# Patient Record
Sex: Male | Born: 1990 | Race: Black or African American | Hispanic: No | Marital: Married | Smoking: Never smoker
Health system: Southern US, Community
[De-identification: ages and names within clinical notes are randomized; demographics above are authoritative.]

## PROBLEM LIST (undated history)

## (undated) DIAGNOSIS — T1491XA Suicide attempt, initial encounter: Secondary | ICD-10-CM

## (undated) DIAGNOSIS — F909 Attention-deficit hyperactivity disorder, unspecified type: Secondary | ICD-10-CM

## (undated) DIAGNOSIS — Z8619 Personal history of other infectious and parasitic diseases: Secondary | ICD-10-CM

## (undated) DIAGNOSIS — J40 Bronchitis, not specified as acute or chronic: Secondary | ICD-10-CM

## (undated) DIAGNOSIS — K469 Unspecified abdominal hernia without obstruction or gangrene: Secondary | ICD-10-CM

## (undated) DIAGNOSIS — A749 Chlamydial infection, unspecified: Secondary | ICD-10-CM

## (undated) DIAGNOSIS — R7611 Nonspecific reaction to tuberculin skin test without active tuberculosis: Secondary | ICD-10-CM

## (undated) DIAGNOSIS — F329 Major depressive disorder, single episode, unspecified: Secondary | ICD-10-CM

## (undated) HISTORY — DX: Chlamydial infection, unspecified: A74.9

## (undated) HISTORY — DX: Nonspecific reaction to tuberculin skin test without active tuberculosis: R76.11

## (undated) HISTORY — DX: Personal history of other infectious and parasitic diseases: Z86.19

## (undated) HISTORY — DX: Major depressive disorder, single episode, unspecified: F32.9

## (undated) HISTORY — DX: Attention-deficit hyperactivity disorder, unspecified type: F90.9

## (undated) HISTORY — DX: Suicide attempt, initial encounter: T14.91XA

## (undated) HISTORY — DX: Unspecified abdominal hernia without obstruction or gangrene: K46.9

---
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
## 2009-02-14 ENCOUNTER — Emergency Department (HOSPITAL_COMMUNITY): Admission: EM | Admit: 2009-02-14 | Discharge: 2009-02-15 | Payer: Self-pay | Admitting: Emergency Medicine

## 2009-02-16 ENCOUNTER — Emergency Department (HOSPITAL_COMMUNITY): Admission: EM | Admit: 2009-02-16 | Discharge: 2009-02-16 | Payer: Self-pay | Admitting: Emergency Medicine

## 2009-04-25 ENCOUNTER — Emergency Department (HOSPITAL_COMMUNITY): Admission: EM | Admit: 2009-04-25 | Discharge: 2009-04-25 | Payer: Self-pay | Admitting: Emergency Medicine

## 2010-01-03 ENCOUNTER — Emergency Department (HOSPITAL_COMMUNITY): Admission: EM | Admit: 2010-01-03 | Discharge: 2010-01-03 | Payer: Self-pay | Admitting: Emergency Medicine

## 2010-03-21 ENCOUNTER — Emergency Department (HOSPITAL_COMMUNITY): Admission: EM | Admit: 2010-03-21 | Discharge: 2010-03-22 | Payer: Self-pay | Admitting: Emergency Medicine

## 2010-06-15 ENCOUNTER — Emergency Department (HOSPITAL_COMMUNITY): Admission: EM | Admit: 2010-06-15 | Discharge: 2010-06-15 | Payer: Self-pay | Admitting: Emergency Medicine

## 2010-07-25 ENCOUNTER — Emergency Department (HOSPITAL_COMMUNITY)
Admission: EM | Admit: 2010-07-25 | Discharge: 2010-07-26 | Payer: Self-pay | Source: Home / Self Care | Admitting: Emergency Medicine

## 2010-10-29 LAB — URINALYSIS, ROUTINE W REFLEX MICROSCOPIC
Glucose, UA: NEGATIVE mg/dL
Protein, ur: NEGATIVE mg/dL
Specific Gravity, Urine: 1.015 (ref 1.005–1.030)
pH: 5.5 (ref 5.0–8.0)

## 2010-10-29 LAB — DIFFERENTIAL
Eosinophils Absolute: 0.3 10*3/uL (ref 0.0–0.7)
Lymphs Abs: 1.7 10*3/uL (ref 0.7–4.0)
Monocytes Relative: 12 % (ref 3–12)
Neutrophils Relative %: 54 % (ref 43–77)

## 2010-10-29 LAB — CBC
MCV: 82.4 fL (ref 78.0–100.0)
RBC: 5.45 MIL/uL (ref 4.22–5.81)
WBC: 6 10*3/uL (ref 4.0–10.5)

## 2010-10-29 LAB — COMPREHENSIVE METABOLIC PANEL
BUN: 24 mg/dL — ABNORMAL HIGH (ref 6–23)
Calcium: 10.1 mg/dL (ref 8.4–10.5)
Glucose, Bld: 101 mg/dL — ABNORMAL HIGH (ref 70–99)
Total Protein: 7.9 g/dL (ref 6.0–8.3)

## 2010-10-29 LAB — BASIC METABOLIC PANEL
Chloride: 106 mEq/L (ref 96–112)
Creatinine, Ser: 0.72 mg/dL (ref 0.4–1.5)
GFR calc Af Amer: >60 mL/min (ref >60)
Potassium: 4.1 mEq/L (ref 3.5–5.1)
Sodium: 141 mEq/L (ref 135–145)

## 2010-11-22 ENCOUNTER — Emergency Department (HOSPITAL_COMMUNITY)
Admission: EM | Admit: 2010-11-22 | Discharge: 2010-11-22 | Disposition: A | Discharge status: Home / Self Care | Payer: Worker's Compensation | Attending: Emergency Medicine | Admitting: Emergency Medicine

## 2010-11-22 ENCOUNTER — Emergency Department (HOSPITAL_COMMUNITY): Payer: Worker's Compensation

## 2010-11-22 DIAGNOSIS — X500XXA Overexertion from strenuous movement or load, initial encounter: Secondary | ICD-10-CM | POA: Insufficient documentation

## 2010-11-22 DIAGNOSIS — M79609 Pain in unspecified limb: Secondary | ICD-10-CM | POA: Insufficient documentation

## 2010-11-22 DIAGNOSIS — M25476 Effusion, unspecified foot: Secondary | ICD-10-CM | POA: Insufficient documentation

## 2010-11-22 DIAGNOSIS — S93409A Sprain of unspecified ligament of unspecified ankle, initial encounter: Secondary | ICD-10-CM | POA: Insufficient documentation

## 2010-11-22 DIAGNOSIS — Y990 Civilian activity done for income or pay: Secondary | ICD-10-CM | POA: Insufficient documentation

## 2010-11-22 DIAGNOSIS — J45909 Unspecified asthma, uncomplicated: Secondary | ICD-10-CM | POA: Insufficient documentation

## 2010-11-22 DIAGNOSIS — M25473 Effusion, unspecified ankle: Secondary | ICD-10-CM | POA: Insufficient documentation

## 2010-11-22 DIAGNOSIS — S93609A Unspecified sprain of unspecified foot, initial encounter: Secondary | ICD-10-CM | POA: Insufficient documentation

## 2010-11-22 DIAGNOSIS — M25579 Pain in unspecified ankle and joints of unspecified foot: Secondary | ICD-10-CM | POA: Insufficient documentation

## 2011-01-13 ENCOUNTER — Emergency Department (HOSPITAL_COMMUNITY)
Admission: EM | Admit: 2011-01-13 | Discharge: 2011-01-14 | Disposition: A | Discharge status: Other Acute Inpatient Hospital | Payer: Self-pay | Attending: Emergency Medicine | Admitting: Emergency Medicine

## 2011-01-13 DIAGNOSIS — X818XXA Intentional self-harm by jumping or lying in front of other moving object, initial encounter: Secondary | ICD-10-CM | POA: Insufficient documentation

## 2011-01-13 DIAGNOSIS — E876 Hypokalemia: Secondary | ICD-10-CM | POA: Insufficient documentation

## 2011-01-13 DIAGNOSIS — F489 Nonpsychotic mental disorder, unspecified: Secondary | ICD-10-CM | POA: Insufficient documentation

## 2011-01-13 LAB — COMPREHENSIVE METABOLIC PANEL
BUN: 9 mg/dL (ref 6–23)
Calcium: 10.2 mg/dL (ref 8.4–10.5)
Creatinine, Ser: 0.8 mg/dL (ref 0.50–1.35)
GFR calc Af Amer: >60 mL/min (ref >60)
Glucose, Bld: 93 mg/dL (ref 70–99)
Total Protein: 7.4 g/dL (ref 6.0–8.3)

## 2011-01-13 LAB — ETHANOL: Alcohol, Ethyl (B): <11 mg/dL (ref 0–11)

## 2011-01-13 LAB — DIFFERENTIAL
Basophils Relative: 1 % (ref 0–1)
Lymphocytes Relative: 27 % (ref 12–46)
Monocytes Absolute: 0.8 10*3/uL (ref 0.1–1.0)
Monocytes Relative: 10 % (ref 3–12)
Neutro Abs: 4.9 10*3/uL (ref 1.7–7.7)

## 2011-01-13 LAB — CBC
HCT: 44.4 % (ref 39.0–52.0)
Hemoglobin: 15 g/dL (ref 13.0–17.0)
MCH: 27.4 pg (ref 26.0–34.0)
MCHC: 33.8 g/dL (ref 30.0–36.0)
MCV: 81 fL (ref 78.0–100.0)

## 2011-01-14 ENCOUNTER — Inpatient Hospital Stay (HOSPITAL_COMMUNITY)
Admission: AD | Admit: 2011-01-14 | Discharge: 2011-01-18 | DRG: 881 | Disposition: A | Discharge status: Home / Self Care | Payer: PRIVATE HEALTH INSURANCE | Attending: Psychiatry | Admitting: Psychiatry

## 2011-01-14 DIAGNOSIS — R45851 Suicidal ideations: Secondary | ICD-10-CM

## 2011-01-14 DIAGNOSIS — F3289 Other specified depressive episodes: Principal | ICD-10-CM

## 2011-01-14 DIAGNOSIS — Z818 Family history of other mental and behavioral disorders: Secondary | ICD-10-CM

## 2011-01-14 DIAGNOSIS — J45909 Unspecified asthma, uncomplicated: Secondary | ICD-10-CM

## 2011-01-14 DIAGNOSIS — F329 Major depressive disorder, single episode, unspecified: Principal | ICD-10-CM

## 2011-01-14 LAB — RAPID URINE DRUG SCREEN, HOSP PERFORMED
Benzodiazepines: NOT DETECTED
Cocaine: NOT DETECTED

## 2011-01-15 DIAGNOSIS — F3289 Other specified depressive episodes: Secondary | ICD-10-CM

## 2011-01-15 DIAGNOSIS — F329 Major depressive disorder, single episode, unspecified: Secondary | ICD-10-CM
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
## 2011-03-10 ENCOUNTER — Emergency Department (HOSPITAL_COMMUNITY): Payer: Self-pay

## 2011-03-10 ENCOUNTER — Emergency Department (HOSPITAL_COMMUNITY)
Admission: EM | Admit: 2011-03-10 | Discharge: 2011-03-11 | Disposition: A | Discharge status: Home / Self Care | Payer: Self-pay | Attending: Emergency Medicine | Admitting: Emergency Medicine

## 2011-03-10 DIAGNOSIS — M25539 Pain in unspecified wrist: Secondary | ICD-10-CM | POA: Insufficient documentation

## 2011-03-10 DIAGNOSIS — W2209XA Striking against other stationary object, initial encounter: Secondary | ICD-10-CM | POA: Insufficient documentation

## 2011-03-10 DIAGNOSIS — IMO0002 Reserved for concepts with insufficient information to code with codable children: Secondary | ICD-10-CM | POA: Insufficient documentation

## 2011-03-10 DIAGNOSIS — S60229A Contusion of unspecified hand, initial encounter: Secondary | ICD-10-CM | POA: Insufficient documentation

## 2011-03-11 ENCOUNTER — Emergency Department (HOSPITAL_COMMUNITY): Payer: Self-pay

## 2011-03-11 LAB — DIFFERENTIAL
Eosinophils Absolute: 0.1 10*3/uL (ref 0.0–0.7)
Eosinophils Relative: 1 % (ref 0–5)
Lymphs Abs: 0.9 10*3/uL (ref 0.7–4.0)
Monocytes Relative: 6 % (ref 3–12)

## 2011-03-11 LAB — BASIC METABOLIC PANEL
BUN: 13 mg/dL (ref 6–23)
Chloride: 104 mEq/L (ref 96–112)
Creatinine, Ser: 0.8 mg/dL (ref 0.50–1.35)
GFR calc Af Amer: >60 mL/min (ref >60)
Glucose, Bld: 113 mg/dL — ABNORMAL HIGH (ref 70–99)

## 2011-03-11 LAB — CBC
HCT: 45.7 % (ref 39.0–52.0)
MCH: 27.4 pg (ref 26.0–34.0)
MCV: 84.2 fL (ref 78.0–100.0)
Platelets: 359 10*3/uL (ref 150–400)
RBC: 5.43 MIL/uL (ref 4.22–5.81)

## 2011-03-29 ENCOUNTER — Emergency Department (HOSPITAL_COMMUNITY)
Admission: EM | Admit: 2011-03-29 | Discharge: 2011-03-29 | Disposition: A | Discharge status: Home / Self Care | Payer: Self-pay | Attending: Emergency Medicine | Admitting: Emergency Medicine

## 2011-03-29 DIAGNOSIS — R10819 Abdominal tenderness, unspecified site: Secondary | ICD-10-CM | POA: Insufficient documentation

## 2011-03-29 DIAGNOSIS — K2990 Gastroduodenitis, unspecified, without bleeding: Secondary | ICD-10-CM | POA: Insufficient documentation

## 2011-03-29 DIAGNOSIS — K2970 Gastritis, unspecified, without bleeding: Secondary | ICD-10-CM | POA: Insufficient documentation

## 2011-04-05 ENCOUNTER — Emergency Department (HOSPITAL_COMMUNITY)
Admission: EM | Admit: 2011-04-05 | Discharge: 2011-04-05 | Disposition: A | Discharge status: Home / Self Care | Payer: Self-pay | Attending: Emergency Medicine | Admitting: Emergency Medicine

## 2011-04-05 ENCOUNTER — Emergency Department (HOSPITAL_COMMUNITY): Payer: Self-pay

## 2011-04-05 DIAGNOSIS — R079 Chest pain, unspecified: Secondary | ICD-10-CM | POA: Insufficient documentation

## 2011-04-05 DIAGNOSIS — I300 Acute nonspecific idiopathic pericarditis: Secondary | ICD-10-CM

## 2011-04-05 DIAGNOSIS — R0602 Shortness of breath: Secondary | ICD-10-CM | POA: Insufficient documentation

## 2011-04-05 DIAGNOSIS — I319 Disease of pericardium, unspecified: Secondary | ICD-10-CM | POA: Insufficient documentation

## 2011-04-05 LAB — CBC
HCT: 44.3 % (ref 39.0–52.0)
MCH: 27.2 pg (ref 26.0–34.0)
MCHC: 32.7 g/dL (ref 30.0–36.0)
MCV: 83 fL (ref 78.0–100.0)
Platelets: 317 10*3/uL (ref 150–400)
RDW: 12.9 % (ref 11.5–15.5)
WBC: 7.5 10*3/uL (ref 4.0–10.5)

## 2011-04-05 LAB — CK TOTAL AND CKMB (NOT AT ARMC)
CK, MB: 3.3 ng/mL (ref 0.3–4.0)
Relative Index: 0.5 (ref 0.0–2.5)
Total CK: 694 U/L — ABNORMAL HIGH (ref 7–232)

## 2011-04-05 LAB — COMPREHENSIVE METABOLIC PANEL
AST: 18 U/L (ref 0–37)
Albumin: 4.1 g/dL (ref 3.5–5.2)
BUN: 14 mg/dL (ref 6–23)
Calcium: 9.6 mg/dL (ref 8.4–10.5)
Creatinine, Ser: 0.83 mg/dL (ref 0.50–1.35)
Total Protein: 7 g/dL (ref 6.0–8.3)

## 2011-04-05 LAB — DIFFERENTIAL
Eosinophils Absolute: 0.2 10*3/uL (ref 0.0–0.7)
Eosinophils Relative: 3 % (ref 0–5)
Lymphs Abs: 1.6 10*3/uL (ref 0.7–4.0)
Monocytes Absolute: 0.7 10*3/uL (ref 0.1–1.0)
Monocytes Relative: 10 % (ref 3–12)

## 2011-04-18 ENCOUNTER — Emergency Department (HOSPITAL_COMMUNITY)
Admission: EM | Admit: 2011-04-18 | Discharge: 2011-04-19 | Disposition: A | Discharge status: Home / Self Care | Payer: Self-pay | Attending: Emergency Medicine | Admitting: Emergency Medicine

## 2011-04-18 DIAGNOSIS — Z79899 Other long term (current) drug therapy: Secondary | ICD-10-CM | POA: Insufficient documentation

## 2011-04-18 DIAGNOSIS — F3289 Other specified depressive episodes: Secondary | ICD-10-CM | POA: Insufficient documentation

## 2011-04-18 DIAGNOSIS — R45851 Suicidal ideations: Secondary | ICD-10-CM | POA: Insufficient documentation

## 2011-04-18 DIAGNOSIS — F329 Major depressive disorder, single episode, unspecified: Secondary | ICD-10-CM | POA: Insufficient documentation

## 2011-04-18 LAB — CBC
Hemoglobin: 14.7 g/dL (ref 13.0–17.0)
MCH: 27.3 pg (ref 26.0–34.0)
MCHC: 32.9 g/dL (ref 30.0–36.0)
MCV: 82.9 fL (ref 78.0–100.0)

## 2011-04-18 LAB — DIFFERENTIAL
Basophils Relative: 1 % (ref 0–1)
Lymphs Abs: 1.3 10*3/uL (ref 0.7–4.0)
Monocytes Absolute: 0.5 10*3/uL (ref 0.1–1.0)
Monocytes Relative: 8 % (ref 3–12)
Neutro Abs: 4.8 10*3/uL (ref 1.7–7.7)

## 2011-04-19 LAB — COMPREHENSIVE METABOLIC PANEL
BUN: 9 mg/dL (ref 6–23)
Calcium: 9.6 mg/dL (ref 8.4–10.5)
Creatinine, Ser: 0.93 mg/dL (ref 0.50–1.35)
GFR calc Af Amer: >60 mL/min (ref >60)
GFR calc non Af Amer: >60 mL/min (ref >60)
Glucose, Bld: 109 mg/dL — ABNORMAL HIGH (ref 70–99)
Sodium: 138 mEq/L (ref 135–145)
Total Protein: 7.5 g/dL (ref 6.0–8.3)

## 2011-04-19 LAB — RAPID URINE DRUG SCREEN, HOSP PERFORMED
Benzodiazepines: NOT DETECTED
Cocaine: NOT DETECTED
Opiates: NOT DETECTED

## 2011-04-19 LAB — ETHANOL: Alcohol, Ethyl (B): <11 mg/dL (ref 0–11)

## 2011-04-23 LAB — CBC
HCT: 45
Hemoglobin: 14.9
MCV: 82.4
Platelets: 350
RDW: 12.8

## 2011-04-23 LAB — D-DIMER, QUANTITATIVE: D-Dimer, Quant: 0.22

## 2011-04-23 LAB — DIFFERENTIAL
Basophils Absolute: 0
Eosinophils Absolute: 0.6
Eosinophils Relative: 7 — ABNORMAL HIGH
Monocytes Absolute: 0.8

## 2011-05-23 ENCOUNTER — Emergency Department (HOSPITAL_COMMUNITY)
Admission: EM | Admit: 2011-05-23 | Discharge: 2011-05-23 | Disposition: A | Discharge status: Home / Self Care | Payer: Self-pay | Attending: Emergency Medicine | Admitting: Emergency Medicine

## 2011-05-23 DIAGNOSIS — R21 Rash and other nonspecific skin eruption: Secondary | ICD-10-CM | POA: Insufficient documentation

## 2011-05-23 DIAGNOSIS — Z049 Encounter for examination and observation for unspecified reason: Secondary | ICD-10-CM | POA: Insufficient documentation

## 2011-06-26 ENCOUNTER — Emergency Department (HOSPITAL_COMMUNITY)
Admission: EM | Admit: 2011-06-26 | Discharge: 2011-06-26 | Disposition: A | Discharge status: Home / Self Care | Payer: PRIVATE HEALTH INSURANCE | Attending: Emergency Medicine | Admitting: Emergency Medicine

## 2011-06-26 ENCOUNTER — Encounter: Payer: Self-pay | Admitting: Adult Health

## 2011-06-26 DIAGNOSIS — J111 Influenza due to unidentified influenza virus with other respiratory manifestations: Secondary | ICD-10-CM | POA: Insufficient documentation

## 2011-06-26 MED ORDER — ACETAMINOPHEN 500 MG PO TABS
1000.0000 mg | ORAL_TABLET | Freq: Once | ORAL | Status: AC
  Administered 2011-06-26: 975 mg via ORAL

## 2011-06-26 MED ORDER — ACETAMINOPHEN 325 MG PO TABS: 650.0000 mg | ORAL_TABLET | Freq: Once | ORAL | Status: DC

## 2011-06-26 MED ORDER — ACETAMINOPHEN 325 MG PO TABS
ORAL_TABLET | ORAL | Status: AC
  Filled 2011-06-26: qty 4

## 2011-06-26 NOTE — ED Provider Notes (Signed)
History     CSN: [id number] Arrival date & time: 06/26/2011  7:43 PM   First MD Initiated Contact with Patient 06/26/11 1959      Chief Complaint  Patient presents with  . Fever     Patient is a 20 y.o. male presenting with flu symptoms. The history is provided by the patient and a relative.  Influenza This is a new problem. The current episode started yesterday. The problem occurs constantly. The problem has been gradually worsening. Associated symptoms include headaches. Pertinent negatives include no chest pain and no shortness of breath. The symptoms are aggravated by nothing. The symptoms are relieved by nothing.  he reports cough/ sore throat, myalgias, headache, fever +sick contacts No SOB reported  Past Medical History  Diagnosis Date  . Asthma     History reviewed. No pertinent past surgical history.  History reviewed. No pertinent family history.  History  Substance Use Topics  . Smoking status: Never Smoker   . Smokeless tobacco: Not on file  . Alcohol Use: No      Review of Systems  Respiratory: Negative for shortness of breath.   Cardiovascular: Negative for chest pain.  Neurological: Positive for headaches.    Allergies  Review of patient's allergies indicates no known allergies.  Home Medications   Current Outpatient Rx  Name Route Sig Dispense Refill  . IBUPROFEN 200 MG PO TABS Oral Take 800 mg by mouth every 6 (six) hours as needed. Pain.     Marland Kitchen PSEUDOEPH-DOXYLAMINE-DM-APAP 60-12.12-19-998 MG/30ML PO LIQD Oral Take 10 mLs by mouth daily as needed. Cough/etc       BP 168/79  Pulse 110  Temp(Src) 103.2 F (39.6 C) (Oral)  Resp 20  SpO2 99%  Physical Exam  CONSTITUTIONAL: Well developed/well nourished HEAD AND FACE: Normocephalic/atraumatic EYES: EOMI/PERRL ENMT: Mucous membranes moist, uvula midline, normal pharynx NECK: supple no meningeal signs SPINE:entire spine nontender CV: S1/S2 noted, no murmurs/rubs/gallops noted LUNGS:  Lungs are clear to auscultation bilaterally, no apparent distress ABDOMEN: soft, nontender, no rebound or guarding NEURO: Pt is awake/alert, moves all extremitiesx4 EXTREMITIES: pulses normal, full ROM SKIN: warm, color normal PSYCH: no abnormalities of mood noted   ED Course  Procedures     1. Influenza       MDM  Nursing notes reviewed and considered in documentation   Pt well appearing, stable for d/c        Joya Gaskins, MD 06/26/11 2118

## 2011-06-26 NOTE — ED Notes (Signed)
Fever, sore throat, nausea, body aches and headaches that began yesterday. Fever 103.2

## 2011-07-07 ENCOUNTER — Encounter: Payer: Self-pay | Admitting: Internal Medicine

## 2011-07-07 DIAGNOSIS — A749 Chlamydial infection, unspecified: Secondary | ICD-10-CM

## 2011-07-07 DIAGNOSIS — J45909 Unspecified asthma, uncomplicated: Secondary | ICD-10-CM | POA: Insufficient documentation

## 2011-07-07 DIAGNOSIS — F32A Depression, unspecified: Secondary | ICD-10-CM

## 2011-07-07 DIAGNOSIS — F909 Attention-deficit hyperactivity disorder, unspecified type: Secondary | ICD-10-CM

## 2011-07-07 DIAGNOSIS — T1491XA Suicide attempt, initial encounter: Secondary | ICD-10-CM

## 2011-07-07 DIAGNOSIS — F329 Major depressive disorder, single episode, unspecified: Secondary | ICD-10-CM | POA: Insufficient documentation

## 2011-07-07 DIAGNOSIS — Z0000 Encounter for general adult medical examination without abnormal findings: Secondary | ICD-10-CM | POA: Insufficient documentation

## 2011-07-07 HISTORY — DX: Depression, unspecified: F32.A

## 2011-07-07 HISTORY — DX: Attention-deficit hyperactivity disorder, unspecified type: F90.9

## 2011-07-07 HISTORY — DX: Chlamydial infection, unspecified: A74.9

## 2011-07-07 HISTORY — DX: Suicide attempt, initial encounter: T14.91XA

## 2011-07-11 ENCOUNTER — Ambulatory Visit (INDEPENDENT_AMBULATORY_CARE_PROVIDER_SITE_OTHER): Payer: PRIVATE HEALTH INSURANCE | Admitting: Internal Medicine

## 2011-07-11 ENCOUNTER — Encounter: Payer: Self-pay | Admitting: Internal Medicine

## 2011-07-11 VITALS — BP 128/80 | HR 70 | Temp 98.3°F | Ht 75.0 in | Wt 205.4 lb

## 2011-07-11 DIAGNOSIS — Z0000 Encounter for general adult medical examination without abnormal findings: Secondary | ICD-10-CM

## 2011-07-11 DIAGNOSIS — J45909 Unspecified asthma, uncomplicated: Secondary | ICD-10-CM

## 2011-07-11 DIAGNOSIS — K429 Umbilical hernia without obstruction or gangrene: Secondary | ICD-10-CM

## 2011-07-11 MED ORDER — ALBUTEROL SULFATE HFA 108 (90 BASE) MCG/ACT IN AERS: 2.0000 | INHALATION_SPRAY | Freq: Four times a day (QID) | RESPIRATORY_TRACT | Status: DC | PRN

## 2011-07-11 MED ORDER — TETANUS-DIPHTH-ACELL PERTUSSIS 5-2.5-18.5 LF-MCG/0.5 IM SUSP
0.5000 mL | Freq: Once | INTRAMUSCULAR | Status: AC
  Administered 2011-07-11: 0.5 mL via INTRAMUSCULAR

## 2011-07-11 NOTE — Assessment & Plan Note (Signed)
stable overall by hx and exam, most recent data reviewed with pt, and pt to continue medical treatment as before SpO2 Readings from Last 3 Encounters:  07/11/11 97%  06/26/11 99%

## 2011-07-11 NOTE — Patient Instructions (Signed)
Your medication was refilled today to [facility] Remember, if you need to use the Proventil more than twice per wk, you will need to Advair as well You will be contacted regarding the referral for: General Surgury You had the tetanus shot today You are otherwise up to date with prevention today Please return in 1 year for your yearly visit, or sooner if needed, with Lab testing done 3-5 days before

## 2011-07-11 NOTE — Progress Notes (Signed)
Subjective:    Patient ID: Earl Compton, male    DOB: 1990/09/17, 20 y.o.   MRN: [id number]  HPI  Here for wellness and f/u;  Overall doing ok;  Pt denies CP, worsening SOB, DOE, wheezing, orthopnea, PND, worsening LE edema, palpitations, dizziness or syncope.  Pt denies neurological change such as new Headache, facial or extremity weakness.  Pt denies polydipsia, polyuria, or low sugar symptoms. Pt states overall good compliance with treatment and medications, good tolerability, and trying to follow lower cholesterol diet.  Pt denies worsening depressive symptoms, suicidal ideation or panic. No fever, wt loss, night sweats, loss of appetite, or other constitutional symptoms.  Pt states good ability with ADL's, low fall risk, home safety reviewed and adequate, no significant changes in hearing or vision, and occasionally active with exercise.  Needs albuterol inhaler refill, does not use more then twice per wk. Does have a tender area that comes and goes to the umbilical area for 2 months. Past Medical History  Diagnosis Date  . Asthma   . Depression 07/07/2011  . Suicide attempt 07/07/2011  . Asthma 07/07/2011  . ADHD (attention deficit hyperactivity disorder) 07/07/2011  . Chlamydia infection 07/07/2011  . History of chickenpox   . Positive TB test    No past surgical history on file.  reports that he has never smoked. He does not have any smokeless tobacco history on file. He reports that he does not drink alcohol or use illicit drugs. family history includes Arthritis in his other and Hypertension in his other. No Known Allergies Current Outpatient Prescriptions on File Prior to Visit  Medication Sig Dispense Refill  . ibuprofen (ADVIL,MOTRIN) 200 MG tablet Take 800 mg by mouth every 6 (six) hours as needed. Pain.        No current facility-administered medications on file prior to visit.   Review of Systems Review of Systems  Constitutional: Negative for diaphoresis, activity  change, appetite change and unexpected weight change.  HENT: Negative for hearing loss, ear pain, facial swelling, mouth sores and neck stiffness.   Eyes: Negative for pain, redness and visual disturbance.  Respiratory: Negative for shortness of breath and wheezing.   Cardiovascular: Negative for chest pain and palpitations.  Gastrointestinal: Negative for diarrhea, blood in stool, abdominal distention and rectal pain.  Genitourinary: Negative for hematuria, flank pain and decreased urine volume.  Musculoskeletal: Negative for myalgias and joint swelling.  Skin: Negative for color change and wound.  Neurological: Negative for syncope and numbness.  Hematological: Negative for adenopathy.  Psychiatric/Behavioral: Negative for hallucinations, self-injury, decreased concentration and agitation.      Objective:   Physical Exam BP 128/80  Pulse 70  Temp(Src) 98.3 F (36.8 C) (Oral)  Ht 6\' 3"  (1.905 m)  Wt 205 lb 6.4 oz (93.169 kg)  BMI 25.67 kg/m2  SpO2 97% Physical Exam  VS noted Constitutional: Pt is oriented to person, place, and time. Appears well-developed and well-nourished.  HENT:  Head: Normocephalic and atraumatic.  Right Ear: External ear normal.  Left Ear: External ear normal.  Nose: Nose normal.  Mouth/Throat: Oropharynx is clear and moist.  Eyes: Conjunctivae and EOM are normal. Pupils are equal, round, and reactive to light.  Neck: Normal range of motion. Neck supple. No JVD present. No tracheal deviation present.  Cardiovascular: Normal rate, regular rhythm, normal heart sounds and intact distal pulses.   Pulmonary/Chest: Effort normal and breath sounds normal.  Abdominal: Soft. Bowel sounds are normal. There is no tenderness. small umbilical  hernia tender reducible noted Musculoskeletal: Normal range of motion. Exhibits no edema.  Lymphadenopathy:  Has no cervical adenopathy.  Neurological: Pt is alert and oriented to person, place, and time. Pt has normal reflexes.  No cranial nerve deficit.  Skin: Skin is warm and dry. No rash noted.  Psychiatric:  Has  normal mood and affect. Behavior is normal.     Assessment & Plan:

## 2011-07-11 NOTE — Assessment & Plan Note (Signed)
Small, reducible  But mild tender and painful - for gen surgury referral

## 2011-07-11 NOTE — Assessment & Plan Note (Signed)
Overall doing well, age appropriate education and counseling updated, referrals for preventative services and immunizations addressed, dietary and smoking counseling addressed, most recent labs and ECG reviewed.  I have personally reviewed and have noted: 1) the patient's medical and social history 2) The pt's use of alcohol, tobacco, and illicit drugs 3) The patient's current medications and supplements 4) Functional ability including ADL's, fall risk, home safety risk, hearing and visual impairment 5) Diet and physical activities 6) Evidence for depression or mood disorder 7) The patient's height, weight, and BMI have been recorded in the chart I have made referrals, and provided counseling and education based on review of the above Declines labs today

## 2011-07-23 ENCOUNTER — Telehealth: Payer: Self-pay

## 2011-07-23 NOTE — Telephone Encounter (Signed)
[facility] Triage Call Report Triage Record Num: [id number] Operator: Amy Head Patient Name: Earl Compton Call Date & Time: 07/21/2011 10:27:47PM Patient Phone: [phone] PCP: Oliver Barre Patient Gender: Male PCP Fax : [phone] Patient DOB: 11-20-1990 Practice Name: [facility] Reason for Call: Caller: [name]/Patient; PCP: Oliver Barre; CB#: [phone]; Call Reason: Hernia Pain; Sx Onset: 07/21/2011; Sx Notes: Hernia located above his belly button. Knot noted and is approx dime or nickel size. Was dx in office at physical approx 1-2 weeks ago. Onset pain 1 week. Is vomiting as well. ONset vomiting when pain started. ; Afebrile; Wt: ; Guideline Used: Hernia ; Disp: ED per guidelines.; Appt Scheduled?: NO Protocol(s) Used: Hernia - Umbilical (Pediatric) Recommended Outcome per Protocol: See ED Immediately Reason for Outcome: [1] Hernia won't go back in AND [2] unexplained vomiting Care Advice: ~ CARE ADVICE given per Hernia - Umbilical (Pediatric) guideline. CALL BACK IF: - Your child becomes worse ~ GO TO ED NOW: Your child needs to be seen within the next hour. Go to the [facility] at [facility]. Leave as soon as you can. ~ 07/21/2011 10:32:48PM Page 1 of 1 [id number]

## 2011-08-07 ENCOUNTER — Ambulatory Visit (INDEPENDENT_AMBULATORY_CARE_PROVIDER_SITE_OTHER): Payer: Self-pay | Admitting: General Surgery

## 2011-08-07 ENCOUNTER — Encounter (INDEPENDENT_AMBULATORY_CARE_PROVIDER_SITE_OTHER): Payer: Self-pay | Admitting: General Surgery

## 2011-08-07 VITALS — BP 130/70 | HR 68 | Temp 96.9°F | Resp 16 | Ht 76.0 in | Wt 206.4 lb

## 2011-08-07 DIAGNOSIS — K439 Ventral hernia without obstruction or gangrene: Secondary | ICD-10-CM

## 2011-08-07 NOTE — Progress Notes (Signed)
Patient ID: Earl Compton, male   DOB: 05-24-1991, 21 y.o.   MRN: [id number]  Chief Complaint  Patient presents with  . Other    Eval hernia    HPI Earl Compton is a 21 y.o. male. This patient is referred by Dr. Jonny Compton bag and an [place] layer and as her and her office for evaluation of a ventral hernia. He states that he's noticed a "knot" at his umbilicus for approximately one year and he states that this does cause some sharp pain as well as some nausea and vomiting with 2 episodes of this. He states the pain is variable but it has increased in size. He states his bowels otherwise normal and denies any other associated symptoms. He does state the pain is worse with spicy foods. HPI  Past Medical History  Diagnosis Date  . Asthma   . Depression 07/07/2011  . Suicide attempt 07/07/2011  . Asthma 07/07/2011  . ADHD (attention deficit hyperactivity disorder) 07/07/2011  . Chlamydia infection 07/07/2011  . History of chickenpox   . Positive TB test   . Hernia     History reviewed. No pertinent past surgical history.  Family History  Problem Relation Age of Onset  . Arthritis Other     Grandparent  . Hypertension Other     Parent, Grandparent    Social History History  Substance Use Topics  . Smoking status: Never Smoker   . Smokeless tobacco: Never Used  . Alcohol Use: No    No Known Allergies  Current Outpatient Prescriptions  Medication Sig Dispense Refill  . albuterol (PROVENTIL HFA;VENTOLIN HFA) 108 (90 BASE) MCG/ACT inhaler Inhale 2 puffs into the lungs every 6 (six) hours as needed.  1 Inhaler  11  . ibuprofen (ADVIL,MOTRIN) 200 MG tablet Take 800 mg by mouth every 6 (six) hours as needed. Pain.         Review of Systems Review of Systems All other review of systems negative or noncontributory except as stated in the HPI  Blood pressure 130/70, pulse 68, temperature 96.9 F (36.1 C), temperature source Temporal, resp. rate 16, height 6\' 4"  (1.93 m), weight  206 lb 6.4 oz (93.622 kg).  Physical Exam Physical Exam Physical Exam  Vitals reviewed. Constitutional: He is oriented to person, place, and time. He appears well-developed and well-nourished. No distress.  HENT:  Head: Normocephalic and atraumatic.  Mouth/Throat: No oropharyngeal exudate.  Eyes: Conjunctivae and EOM are normal. Pupils are equal, round, and reactive to light. Right eye exhibits no discharge. Left eye exhibits no discharge. No scleral icterus.  Neck: Normal range of motion. No tracheal deviation present.  Cardiovascular: Normal rate, regular rhythm and normal heart sounds.   Pulmonary/Chest: Effort normal and breath sounds normal. No stridor. No respiratory distress. He has no wheezes. He has no rales. He exhibits no tenderness.  Abdominal: Soft. Bowel sounds are normal. He exhibits no distension and no mass. There is no tenderness. There is no rebound and no guarding. Reducible bulge just above umbilicus in the midline.  Musculoskeletal: Normal range of motion. He exhibits no edema and no tenderness.  Neurological: He is alert and oriented to person, place, and time.  Skin: Skin is warm and dry. No rash noted. He is not diaphoretic. No erythema. No pallor.  Psychiatric: He has a normal mood and affect. His behavior is normal. Judgment and thought content normal.    Data Reviewed   Assessment    Supraumbilical/ventral hernia He has a reducible  supraumbilical hernia which is causing symptoms. And I have offered repair for this. He is interested in surgical repair. We discussed the procedure as well as the options of continued observation. We discussed the risks of infection, bleeding, pain, scarring, recurrence, persistent symptoms, and bowel injury and his present changes arch proceed with ventral hernia with possible mesh.      Plan    We will plan for surgical repair as soon as available.       Lodema Pilot DAVID 08/07/2011, 5:59 PM

## 2011-08-10 ENCOUNTER — Telehealth (INDEPENDENT_AMBULATORY_CARE_PROVIDER_SITE_OTHER): Payer: Self-pay

## 2011-08-10 NOTE — Telephone Encounter (Signed)
Scheduled pre-op consult w/Dr. Biagio Quint on 09/05/11, patient need's later pm appointment due to work schedule.

## 2011-09-05 ENCOUNTER — Encounter (HOSPITAL_COMMUNITY): Payer: Self-pay | Admitting: Pharmacy Technician

## 2011-09-05 ENCOUNTER — Encounter (INDEPENDENT_AMBULATORY_CARE_PROVIDER_SITE_OTHER): Payer: Self-pay | Admitting: General Surgery

## 2011-09-07 ENCOUNTER — Encounter (INDEPENDENT_AMBULATORY_CARE_PROVIDER_SITE_OTHER): Payer: Self-pay | Admitting: General Surgery

## 2011-09-13 ENCOUNTER — Encounter (HOSPITAL_COMMUNITY)
Admission: RE | Admit: 2011-09-13 | Discharge: 2011-09-13 | Disposition: A | Discharge status: Home / Self Care | Payer: PRIVATE HEALTH INSURANCE | Source: Ambulatory Visit | Attending: General Surgery | Admitting: General Surgery

## 2011-09-13 ENCOUNTER — Encounter (HOSPITAL_COMMUNITY): Payer: Self-pay

## 2011-09-13 DIAGNOSIS — K439 Ventral hernia without obstruction or gangrene: Secondary | ICD-10-CM

## 2011-09-13 HISTORY — DX: Bronchitis, not specified as acute or chronic: J40

## 2011-09-13 LAB — BASIC METABOLIC PANEL
BUN: 11 mg/dL (ref 6–23)
Calcium: 10.1 mg/dL (ref 8.4–10.5)
GFR calc Af Amer: >90 mL/min (ref >90)
GFR calc non Af Amer: >90 mL/min (ref >90)
Potassium: 5.1 mEq/L (ref 3.5–5.1)

## 2011-09-13 LAB — CBC
Hemoglobin: 15.1 g/dL (ref 13.0–17.0)
MCHC: 33.8 g/dL (ref 30.0–36.0)
Platelets: 314 10*3/uL (ref 150–400)
RDW: 12.5 % (ref 11.5–15.5)

## 2011-09-13 LAB — SURGICAL PCR SCREEN: MRSA, PCR: NEGATIVE

## 2011-09-13 NOTE — Pre-Procedure Instructions (Signed)
[name]  09/13/2011   Your procedure is scheduled on:  September 20, 2011 at 0730 am  Report to [facility] at 0530 AM.  Call this number if you have problems the morning of surgery: [phone]   Remember:   Do not eat food:After Midnight.  May have clear liquids: up to 4 Hours before arrival.  Clear liquids include soda, tea, black coffee, apple or grape juice, broth.  Take these medicines the morning of surgery with A SIP OF WATER: Albuterol (bring inhaler with you)   Do not wear jewelry, make-up or nail polish.  Do not wear lotions, powders, or perfumes. You may wear deodorant.  Do not shave 48 hours prior to surgery.  Do not bring valuables to the hospital.  Contacts, dentures or bridgework may not be worn into surgery.  Leave suitcase in the car. After surgery it may be brought to your room.  For patients admitted to the hospital, checkout time is 11:00 AM the day of discharge.   Patients discharged the day of surgery will not be allowed to drive home.  Name and phone number of your driver:   Special Instructions: CHG Shower Use Special Wash: 1/2 bottle night before surgery and 1/2 bottle morning of surgery.   Please read over the following fact sheets that you were given: Pain Booklet, Coughing and Deep Breathing, MRSA Information and Surgical Site Infection Prevention

## 2011-09-19 ENCOUNTER — Encounter (INDEPENDENT_AMBULATORY_CARE_PROVIDER_SITE_OTHER): Payer: Self-pay | Admitting: General Surgery

## 2011-09-19 MED ORDER — CEFAZOLIN SODIUM 1-5 GM-% IV SOLN: 1.0000 g | INTRAVENOUS | Status: DC

## 2011-09-20 ENCOUNTER — Ambulatory Visit (HOSPITAL_COMMUNITY): Payer: PRIVATE HEALTH INSURANCE | Admitting: Certified Registered"

## 2011-09-20 ENCOUNTER — Ambulatory Visit (HOSPITAL_COMMUNITY)
Admission: RE | Admit: 2011-09-20 | Discharge: 2011-09-20 | Disposition: A | Discharge status: Home / Self Care | Payer: PRIVATE HEALTH INSURANCE | Source: Ambulatory Visit | Attending: General Surgery | Admitting: General Surgery

## 2011-09-20 ENCOUNTER — Encounter (HOSPITAL_COMMUNITY): Payer: Self-pay | Admitting: *Deleted

## 2011-09-20 ENCOUNTER — Encounter (HOSPITAL_COMMUNITY)
Admission: RE | Disposition: A | Discharge status: Home / Self Care | Payer: Self-pay | Source: Ambulatory Visit | Attending: General Surgery

## 2011-09-20 ENCOUNTER — Encounter (HOSPITAL_COMMUNITY): Payer: Self-pay | Admitting: Certified Registered"

## 2011-09-20 DIAGNOSIS — K439 Ventral hernia without obstruction or gangrene: Secondary | ICD-10-CM

## 2011-09-20 DIAGNOSIS — Z01812 Encounter for preprocedural laboratory examination: Secondary | ICD-10-CM | POA: Insufficient documentation

## 2011-09-20 DIAGNOSIS — Z01810 Encounter for preprocedural cardiovascular examination: Secondary | ICD-10-CM | POA: Insufficient documentation

## 2011-09-20 DIAGNOSIS — F329 Major depressive disorder, single episode, unspecified: Secondary | ICD-10-CM | POA: Insufficient documentation

## 2011-09-20 DIAGNOSIS — Z01818 Encounter for other preprocedural examination: Secondary | ICD-10-CM | POA: Insufficient documentation

## 2011-09-20 DIAGNOSIS — F3289 Other specified depressive episodes: Secondary | ICD-10-CM | POA: Insufficient documentation

## 2011-09-20 DIAGNOSIS — K429 Umbilical hernia without obstruction or gangrene: Secondary | ICD-10-CM | POA: Insufficient documentation

## 2011-09-20 DIAGNOSIS — F909 Attention-deficit hyperactivity disorder, unspecified type: Secondary | ICD-10-CM | POA: Insufficient documentation

## 2011-09-20 DIAGNOSIS — J45909 Unspecified asthma, uncomplicated: Secondary | ICD-10-CM | POA: Insufficient documentation

## 2011-09-20 HISTORY — PX: VENTRAL HERNIA REPAIR: SHX424

## 2011-09-20 SURGERY — REPAIR, HERNIA, VENTRAL
Anesthesia: General | Site: Abdomen | Wound class: Clean

## 2011-09-20 MED ORDER — ACETAMINOPHEN 10 MG/ML IV SOLN
INTRAVENOUS | Status: DC | PRN
  Administered 2011-09-20: 1000 mg via INTRAVENOUS

## 2011-09-20 MED ORDER — MIDAZOLAM HCL 5 MG/5ML IJ SOLN
INTRAMUSCULAR | Status: DC | PRN
  Administered 2011-09-20: 2 mg via INTRAVENOUS

## 2011-09-20 MED ORDER — NEOSTIGMINE METHYLSULFATE 1 MG/ML IJ SOLN
INTRAMUSCULAR | Status: DC | PRN
  Administered 2011-09-20: 3 mg via INTRAVENOUS

## 2011-09-20 MED ORDER — MEPERIDINE HCL 25 MG/ML IJ SOLN: 6.2500 mg | INTRAMUSCULAR | Status: DC | PRN

## 2011-09-20 MED ORDER — LACTATED RINGERS IV SOLN
INTRAVENOUS | Status: DC | PRN
  Administered 2011-09-20 (×2): via INTRAVENOUS

## 2011-09-20 MED ORDER — FENTANYL CITRATE 0.05 MG/ML IJ SOLN
INTRAMUSCULAR | Status: DC | PRN
  Administered 2011-09-20: 150 ug via INTRAVENOUS
  Administered 2011-09-20 (×2): 50 ug via INTRAVENOUS

## 2011-09-20 MED ORDER — ACETAMINOPHEN 10 MG/ML IV SOLN
INTRAVENOUS | Status: AC
  Filled 2011-09-20: qty 100

## 2011-09-20 MED ORDER — CEFAZOLIN SODIUM-DEXTROSE 2-3 GM-% IV SOLR
2.0000 g | Freq: Once | INTRAVENOUS | Status: DC
  Filled 2011-09-20: qty 50

## 2011-09-20 MED ORDER — GLYCOPYRROLATE 0.2 MG/ML IJ SOLN
INTRAMUSCULAR | Status: DC | PRN
  Administered 2011-09-20: .4 mg via INTRAVENOUS

## 2011-09-20 MED ORDER — HYDROMORPHONE HCL PF 1 MG/ML IJ SOLN: 0.2500 mg | INTRAMUSCULAR | Status: DC | PRN

## 2011-09-20 MED ORDER — PROPOFOL 10 MG/ML IV EMUL
INTRAVENOUS | Status: DC | PRN
  Administered 2011-09-20: 100 mg via INTRAVENOUS
  Administered 2011-09-20: 200 mg via INTRAVENOUS

## 2011-09-20 MED ORDER — LIDOCAINE HCL 1 % IJ SOLN
INTRAMUSCULAR | Status: DC | PRN
  Administered 2011-09-20: 09:00:00 via SUBCUTANEOUS

## 2011-09-20 MED ORDER — HYDROCODONE-ACETAMINOPHEN 5-325 MG PO TABS: 1.0000 | ORAL_TABLET | ORAL | Status: AC | PRN

## 2011-09-20 MED ORDER — ONDANSETRON HCL 4 MG/2ML IJ SOLN
INTRAMUSCULAR | Status: DC | PRN
  Administered 2011-09-20: 4 mg via INTRAVENOUS

## 2011-09-20 MED ORDER — PROMETHAZINE HCL 25 MG/ML IJ SOLN: 6.2500 mg | INTRAMUSCULAR | Status: DC | PRN

## 2011-09-20 MED ORDER — 0.9 % SODIUM CHLORIDE (POUR BTL) OPTIME
TOPICAL | Status: DC | PRN
  Administered 2011-09-20: 1000 mL

## 2011-09-20 MED ORDER — ROCURONIUM BROMIDE 100 MG/10ML IV SOLN
INTRAVENOUS | Status: DC | PRN
  Administered 2011-09-20: 40 mg via INTRAVENOUS

## 2011-09-20 SURGICAL SUPPLY — 48 items
BAG DECANTER FOR FLEXI CONT (MISCELLANEOUS) ×2 IMPLANT
BLADE SURG ROTATE 9660 (MISCELLANEOUS) IMPLANT
CANISTER SUCTION 2500CC (MISCELLANEOUS) ×2 IMPLANT
CHLORAPREP W/TINT 26ML (MISCELLANEOUS) ×2 IMPLANT
CLOTH BEACON ORANGE TIMEOUT ST (SAFETY) ×2 IMPLANT
COVER SURGICAL LIGHT HANDLE (MISCELLANEOUS) ×2 IMPLANT
DECANTER SPIKE VIAL GLASS SM (MISCELLANEOUS) IMPLANT
DERMABOND ADVANCED (GAUZE/BANDAGES/DRESSINGS) ×1
DERMABOND ADVANCED .7 DNX12 (GAUZE/BANDAGES/DRESSINGS) ×1 IMPLANT
DRAPE LAPAROSCOPIC ABDOMINAL (DRAPES) ×2 IMPLANT
DRSG TEGADERM 4X4.75 (GAUZE/BANDAGES/DRESSINGS) ×2 IMPLANT
ELECT CAUTERY BLADE 6.4 (BLADE) ×2 IMPLANT
ELECT COATED BLADE 2.86 ST (ELECTRODE) ×2 IMPLANT
ELECT REM PT RETURN 9FT ADLT (ELECTROSURGICAL) ×2
ELECTRODE REM PT RTRN 9FT ADLT (ELECTROSURGICAL) ×1 IMPLANT
GAUZE SPONGE 2X2 8PLY STRL LF (GAUZE/BANDAGES/DRESSINGS) ×1 IMPLANT
GLOVE EXAM NITRILE MD LF STRL (GLOVE) ×2 IMPLANT
GLOVE SURG SS PI 7.5 STRL IVOR (GLOVE) ×4 IMPLANT
GOWN PREVENTION PLUS XLARGE (GOWN DISPOSABLE) ×2 IMPLANT
GOWN STRL NON-REIN LRG LVL3 (GOWN DISPOSABLE) ×4 IMPLANT
KIT BASIN OR (CUSTOM PROCEDURE TRAY) ×2 IMPLANT
KIT ROOM TURNOVER OR (KITS) ×2 IMPLANT
MESH VENTRALEX ST 1-7/10 CRC S (Mesh General) ×2 IMPLANT
NEEDLE HYPO 25GX1X1/2 BEV (NEEDLE) ×2 IMPLANT
NS IRRIG 1000ML POUR BTL (IV SOLUTION) ×2 IMPLANT
PACK GENERAL/GYN (CUSTOM PROCEDURE TRAY) ×2 IMPLANT
PAD ARMBOARD 7.5X6 YLW CONV (MISCELLANEOUS) ×2 IMPLANT
SLEEVE SURGEON STRL (DRAPES) ×4 IMPLANT
SPONGE GAUZE 2X2 STER 10/PKG (GAUZE/BANDAGES/DRESSINGS) ×1
SPONGE GAUZE 4X4 12PLY (GAUZE/BANDAGES/DRESSINGS) IMPLANT
STAPLER VISISTAT 35W (STAPLE) IMPLANT
SUT ETHIBOND 0 MO6 C/R (SUTURE) ×2 IMPLANT
SUT MON AB 4-0 PC3 18 (SUTURE) ×2 IMPLANT
SUT NOVA NAB GS-21 1 T12 (SUTURE) IMPLANT
SUT PDS AB 1 CTX 36 (SUTURE) IMPLANT
SUT PROLENE 0 CT 1 CR/8 (SUTURE) IMPLANT
SUT PROLENE 2 0 SH DA (SUTURE) ×8 IMPLANT
SUT VIC AB 2-0 CT1 27 (SUTURE)
SUT VIC AB 2-0 CT1 TAPERPNT 27 (SUTURE) IMPLANT
SUT VIC AB 3-0 CT1 27 (SUTURE)
SUT VIC AB 3-0 CT1 TAPERPNT 27 (SUTURE) IMPLANT
SUT VIC AB 3-0 SH 27 (SUTURE) ×2
SUT VIC AB 3-0 SH 27X BRD (SUTURE) ×1 IMPLANT
SUT VIC AB 3-0 SH 27XBRD (SUTURE) ×1 IMPLANT
SYR CONTROL 10ML LL (SYRINGE) ×2 IMPLANT
TOWEL OR 17X24 6PK STRL BLUE (TOWEL DISPOSABLE) ×2 IMPLANT
TOWEL OR 17X26 10 PK STRL BLUE (TOWEL DISPOSABLE) ×2 IMPLANT
TRAY FOLEY CATH 14FRSI W/METER (CATHETERS) IMPLANT

## 2011-09-20 NOTE — Anesthesia Postprocedure Evaluation (Signed)
  Anesthesia Post-op Note  Patient: Earl Compton  Procedure(s) Performed: Procedure(s) (LRB): HERNIA REPAIR VENTRAL ADULT (N/A)  Patient Location: PACU  Anesthesia Type: General  Level of Consciousness: sedated  Airway and Oxygen Therapy: Patient Spontanous Breathing and Patient connected to nasal cannula oxygen  Post-op Pain: none  Post-op Assessment: Post-op Vital signs reviewed, Patient's Cardiovascular Status Stable, Respiratory Function Stable, Patent Airway and No signs of Nausea or vomiting  Post-op Vital Signs: Reviewed and stable  Complications: No apparent anesthesia complications

## 2011-09-20 NOTE — Anesthesia Preprocedure Evaluation (Signed)
Anesthesia Evaluation  Patient identified by MRN, date of birth, ID band Patient awake    Reviewed: Allergy & Precautions, H&P , NPO status , Patient's Chart, lab work & pertinent test results, reviewed documented beta blocker date and time   Airway Mallampati: I TM Distance: >3 FB     Dental  (+) Teeth Intact   Pulmonary asthma ,          Cardiovascular     Neuro/Psych PSYCHIATRIC DISORDERS Depression    GI/Hepatic   Endo/Other    Renal/GU      Musculoskeletal   Abdominal   Peds  Hematology   Anesthesia Other Findings   Reproductive/Obstetrics                           Anesthesia Physical Anesthesia Plan  ASA: II  Anesthesia Plan: General   Post-op Pain Management:    Induction: Intravenous  Airway Management Planned: Oral ETT  Additional Equipment:   Intra-op Plan:   Post-operative Plan:   Informed Consent: I have reviewed the patients History and Physical, chart, labs and discussed the procedure including the risks, benefits and alternatives for the proposed anesthesia with the patient or authorized representative who has indicated his/her understanding and acceptance.   Dental advisory given  Plan Discussed with: Anesthesiologist and Surgeon  Anesthesia Plan Comments:         Anesthesia Quick Evaluation

## 2011-09-20 NOTE — Brief Op Note (Signed)
09/20/2011  9:22 AM  PATIENT:  Earl Compton  21 y.o. male  PRE-OPERATIVE DIAGNOSIS:  repair defect in abdominal wall  POST-OPERATIVE DIAGNOSIS:  repair defect in abdominal wall  PROCEDURE:  Procedure(s) (LRB): HERNIA REPAIR VENTRAL ADULT (N/A)  SURGEON:  Surgeon(s) and Role:    * Rulon Abide, DO - Primary  PHYSICIAN ASSISTANT:   ASSISTANTS: none   ANESTHESIA:   general  EBL:  Total I/O In: 1300 [I.V.:1300] Out: 20 [Blood:20]  BLOOD ADMINISTERED:none  DRAINS: none   LOCAL MEDICATIONS USED:  MARCAINE    and LIDOCAINE   SPECIMEN:  No Specimen  DISPOSITION OF SPECIMEN:  PATHOLOGY  COUNTS:  YES  TOURNIQUET:  * No tourniquets in log *  DICTATION: .Other Dictation: Dictation Number [id number]  PLAN OF CARE: Discharge to home after PACU  PATIENT DISPOSITION:  PACU - hemodynamically stable.   Delay start of Pharmacological VTE agent (>24hrs) due to surgical blood loss or risk of bleeding: no

## 2011-09-20 NOTE — Discharge Instructions (Signed)
Hernia °A hernia occurs when an internal organ pushes out through a weak spot in the abdominal wall. Hernias most commonly occur in the groin and around the navel. Hernias often can be pushed back into place (reduced). Most hernias tend to get worse over time. Some abdominal hernias can get stuck in the opening (irreducible or incarcerated hernia) and cannot be reduced. An irreducible abdominal hernia which is tightly squeezed into the opening is at risk for impaired blood supply (strangulated hernia). A strangulated hernia is a medical emergency. Because of the risk for an irreducible or strangulated hernia, surgery may be recommended to repair a hernia. °CAUSES  °· Heavy lifting.  °· Prolonged coughing.  °· Straining to have a bowel movement.  °· A cut (incision) made during an abdominal surgery.  °HOME CARE INSTRUCTIONS  °· Bed rest is not required. You may continue your normal activities.  °· Avoid lifting more than 10 pounds (4.5 kg) or straining.  °· Cough gently. If you are a smoker it is best to stop. Even the best hernia repair can break down with the continual strain of coughing. Even if you do not have your hernia repaired, a cough will continue to aggravate the problem.  °· Do not wear anything tight over your hernia. Do not try to keep it in with an outside bandage or truss. These can damage abdominal contents if they are trapped within the hernia sac.  °· Eat a normal diet.  °· Avoid constipation. Straining over long periods of time will increase hernia size and encourage breakdown of repairs. If you cannot do this with diet alone, stool softeners may be used.  °SEEK IMMEDIATE MEDICAL CARE IF:  °· You have a fever.  °· You develop increasing abdominal pain.  °· You feel nauseous or vomit.  °· Your hernia is stuck outside the abdomen, looks discolored, feels hard, or is tender.  °· You have any changes in your bowel habits or in the hernia that are unusual for you.  °· You have increased pain or  swelling around the hernia.  °· You cannot push the hernia back in place by applying gentle pressure while lying down.  °MAKE SURE YOU:  °· Understand these instructions.  °· Will watch your condition.  °· Will get help right away if you are not doing well or get worse.  °Document Released: 07/09/2005 Document Revised: 03/21/2011 Document Reviewed: 02/26/2008 °[employer]® Patient Information ©2012 [employer]. °

## 2011-09-20 NOTE — Transfer of Care (Signed)
Immediate Anesthesia Transfer of Care Note  Patient: Earl Compton  Procedure(s) Performed: Procedure(s) (LRB): HERNIA REPAIR VENTRAL ADULT (N/A)  Patient Location: PACU  Anesthesia Type: General  Level of Consciousness: awake, alert , oriented and patient cooperative  Airway & Oxygen Therapy: Patient Spontanous Breathing and Patient connected to nasal cannula oxygen  Post-op Assessment: Report given to PACU RN, Post -op Vital signs reviewed and stable and Patient moving all extremities  Post vital signs: Reviewed and stable  Complications: No apparent anesthesia complications

## 2011-09-20 NOTE — Preoperative (Signed)
Beta Blockers   Reason not to administer Beta Blockers:Not Applicable 

## 2011-09-20 NOTE — H&P (Signed)
HPI  Earl Compton is a 21 y.o. male. This patient is referred by Dr. Jonny Ruiz bag and an [place] layer and as her and her office for evaluation of a ventral hernia. He states that he's noticed a "knot" at his umbilicus for approximately one year and he states that this does cause some sharp pain as well as some nausea and vomiting with 2 episodes of this. He states the pain is variable but it has increased in size. He states his bowels otherwise normal and denies any other associated symptoms. He does state the pain is worse with spicy foods.  HPI  Past Medical History   Diagnosis  Date   .  Asthma    .  Depression  07/07/2011   .  Suicide attempt  07/07/2011   .  Asthma  07/07/2011   .  ADHD (attention deficit hyperactivity disorder)  07/07/2011   .  Chlamydia infection  07/07/2011   .  History of chickenpox    .  Positive TB test    .  Hernia     History reviewed. No pertinent past surgical history.  Family History   Problem  Relation  Age of Onset   .  Arthritis  Other       Grandparent    .  Hypertension  Other       Parent, Grandparent    Social History  History   Substance Use Topics   .  Smoking status:  Never Smoker   .  Smokeless tobacco:  Never Used   .  Alcohol Use:  No    No Known Allergies  Current Outpatient Prescriptions   Medication  Sig  Dispense  Refill   .  albuterol (PROVENTIL HFA;VENTOLIN HFA) 108 (90 BASE) MCG/ACT inhaler  Inhale 2 puffs into the lungs every 6 (six) hours as needed.  1 Inhaler  11   .  ibuprofen (ADVIL,MOTRIN) 200 MG tablet  Take 800 mg by mouth every 6 (six) hours as needed. Pain.      Review of Systems  Review of Systems  All other review of systems negative or noncontributory except as stated in the HPI   Physical Exam  Physical Exam  Physical Exam  Vitals reviewed.  Constitutional: He is oriented to person, place, and time. He appears well-developed and well-nourished. No distress.  HENT:  Head: Normocephalic and atraumatic.    Mouth/Throat: No oropharyngeal exudate.  Eyes: Conjunctivae and EOM are normal. Pupils are equal, round, and reactive to light. Right eye exhibits no discharge. Left eye exhibits no discharge. No scleral icterus.  Neck: Normal range of motion. No tracheal deviation present.  Cardiovascular: Normal rate, regular rhythm and normal heart sounds.  Pulmonary/Chest: Effort normal and breath sounds normal. No stridor. No respiratory distress. He has no wheezes. He has no rales. He exhibits no tenderness.  Abdominal: Soft. Bowel sounds are normal. He exhibits no distension and no mass. There is no tenderness. There is no rebound and no guarding. Reducible bulge just above umbilicus in the midline.  Musculoskeletal: Normal range of motion. He exhibits no edema and no tenderness.  Neurological: He is alert and oriented to person, place, and time.  Skin: Skin is warm and dry. No rash noted. He is not diaphoretic. No erythema. No pallor.  Psychiatric: He has a normal mood and affect. His behavior is normal. Judgment and thought content normal.  Data Reviewed  Assessment   Supraumbilical/ventral hernia  He was seen and evaluated in  the preop area.  Site marked and hernia still reducible.  He has a reducible supraumbilical hernia which is causing symptoms. And I have offered repair for this. He is interested in surgical repair. We discussed the procedure as well as the options of continued observation. We discussed the risks of infection, bleeding, pain, scarring, recurrence, persistent symptoms, and bowel injury and his present changes arch proceed with ventral hernia with possible mesh.

## 2011-09-20 NOTE — Anesthesia Procedure Notes (Signed)
Procedure Name: Intubation Date/Time: 09/20/2011 7:36 AM Performed by: Einar Crow Pre-anesthesia Checklist: Patient identified, Emergency Drugs available, Suction available and Patient being monitored Patient Re-evaluated:Patient Re-evaluated prior to inductionOxygen Delivery Method: Circle system utilized Preoxygenation: Pre-oxygenation with 100% oxygen Intubation Type: IV induction Ventilation: Mask ventilation without difficulty and Oral airway inserted - appropriate to patient size Laryngoscope Size: Mac and 4 Grade View: Grade I Tube type: Oral Tube size: 7.5 mm Number of attempts: 1 Airway Equipment and Method: Stylet Placement Confirmation: ETT inserted through vocal cords under direct vision,  positive ETCO2 and breath sounds checked- equal and bilateral Secured at: 23 cm Tube secured with: Tape Dental Injury: Teeth and Oropharynx as per pre-operative assessment

## 2011-09-20 NOTE — Op Note (Signed)
NAMELUCKY, TROTTA              ACCOUNT NO.:  [id number]  MEDICAL RECORD NO.:  [id number]  LOCATION:  [facility]                         FACILITY:  [facility]  PHYSICIAN:  Lodema Pilot, MD       DATE OF BIRTH:  02/02/1991  DATE OF PROCEDURE:  09/20/2011 DATE OF DISCHARGE:                              OPERATIVE REPORT   PROCEDURE:  Open repair of ventral hernia.  PREOPERATIVE DIAGNOSIS:  Ventral hernia.  POSTOPERATIVE DIAGNOSIS:  Ventral hernia.  SURGEON:  Lodema Pilot, M.D.  ASSISTANT:  None.  ANESTHESIA:  General endotracheal anesthesia with 30 mL of 1% lidocaine and 0.25% Marcaine with epinephrine in a 50:50 mixture.  FLUIDS:  1300 mL of crystalloid.  ESTIMATED BLOOD LOSS:  Minimal.  DRAINS:  None.  SPECIMENS:  None.  COMPLICATIONS:  None apparent.  FINDINGS:  Small supraumbilical defect with small umbilical hernia defect.  These contained preperitoneal fat and placement of 4.3 cm Ventralex ST Mesh.  INDICATION FOR PROCEDURE:  Mr. Gulla is a 21 year old male with a reducible supraumbilical hernia on exam, desires repair.  OPERATIVE DETAILS:  Mr. Dennin was seen and evaluated in the preoperative area.  Risks and benefits of procedure were again discussed in lay terms.  Informed consent was obtained and a surgical site was marked prior to anesthetic administration.  Prophylactic antibiotics were given.  He was taken to the operating room and placed on the table in supine position, general endotracheal anesthesia was obtained.  The abdomen was prepped and draped in a standard surgical fashion.  A supraumbilical midline incision was made in the skin and dissection was carried down to the subcutaneous tissue using Bovie electrocautery.  The hernia sac was identified and entered, and the hernia contents which appeared to be preperitoneal fat.  The subcutaneous fat was divided down to the fascia.  The fascial edges were identified and dissected circumferentially.  The  preperitoneal hernial sac was removed and the preperitoneal fat was easily reduced.  There was prior 1 cm defect with an adjacent tiny 3 mm defects just to the right of the midline defect and these hernia defects were connected to create a single hernia defect.  In the undersurface of the fascia, a preperitoneal space was developed using blunt dissection.  He also had a very small umbilical hernia and umbilical fat was removed from the defect, and after the undersurface of the fascia was cleared of any fat adhesions, a 4.3 cm x 4.3 cm Ventralex ST patch was placed into the preperitoneal space and sutured with transfascial sutures at the 12 o'clock, 3 o'clock, 6 o'clock, and 9 o'clock positions on the mesh and sutures were brought out through the fascia under direct visualization taking care to avoid any injury to bowel contents on the inferior aspect that covered the umbilical defect.  The sutures were secured and the mesh appeared to lie flat and cover all 3 defects, and the wound was irrigated with sterile saline solution and inspected for hemostasis.  The fascial edges were approximated with interrupted 0 Ethibond sutures, tacking the tails of the mesh actually in the sutures as well, keeping the mesh adherent to the abdominal wall that covering the mesh.  The  wound was irrigated with sterile saline solution and hemostasis again obtained with Bovie electrocautery.  The wound was then injected with 30 mL of 1% lidocaine and 0.25% Marcaine with epinephrine in a 50:50 mixture, and the dermis was approximated with interrupted 3-0 Vicryl sutures.  Skin edges were approximated with 4-0 Monocryl subcuticular suture.  Skin was washed and dried, and Dermabond was applied.  Sterile suction dressing was applied. All sponge, instrument counts correct at the end of the case.  The patient tolerated the procedure well.  There were no apparent complications.  He did wake up very wild  from his  anesthesia sitting up on the bed.  I held pressure on the wound and did not feel any breaking or rupture of the sutures, but he was very wild up on wake-up.          ______________________________ Lodema Pilot, MD     BL/MEDQ  D:  09/20/2011  T:  09/20/2011  Job:  [id number]

## 2011-09-22 ENCOUNTER — Encounter (HOSPITAL_COMMUNITY): Payer: Self-pay | Admitting: General Surgery

## 2011-10-12 ENCOUNTER — Encounter (INDEPENDENT_AMBULATORY_CARE_PROVIDER_SITE_OTHER): Payer: Self-pay | Admitting: General Surgery

## 2011-10-12 ENCOUNTER — Ambulatory Visit (INDEPENDENT_AMBULATORY_CARE_PROVIDER_SITE_OTHER): Payer: Self-pay | Admitting: General Surgery

## 2011-10-12 VITALS — BP 142/100 | HR 76 | Temp 98.8°F | Resp 18 | Ht 76.0 in | Wt 201.0 lb

## 2011-10-12 DIAGNOSIS — Z4889 Encounter for other specified surgical aftercare: Secondary | ICD-10-CM

## 2011-10-12 DIAGNOSIS — Z5189 Encounter for other specified aftercare: Secondary | ICD-10-CM

## 2011-10-12 NOTE — Progress Notes (Signed)
Subjective:     Patient ID: Earl Compton, male   DOB: July 10, 1991, 21 y.o.   MRN: [id number]  HPI This patient follows up 3 weeks status post open umbilical hernia repair with mesh. He has returned to work and that she return to work a couple days after surgery and states that he has had no problems. He denies any pain. He did have some nausea for the first day after surgery but that shortly resolved and has no complaints today.  Review of Systems     Objective:   Physical Exam No distress and nontoxic-appearing  His abdomen is soft and nontender exam his incision looks great without sign of infection he has normal postoperative healing ridge but no evidence of recurrent hernia with Valsalva.    Assessment:     Status post open umbilical hernia repair with mesh-doing well He seems to be doing very well from this procedure and has no evidence of any postoperative complication. There is no evidence of hernia.    Plan:     Since he is doing very well, he can return to activity as tolerated and another week. I recommend that he refrain from lifting for another one to 2 weeks but other than that he can increase his activity as tolerated and follow up with me on a p.r.n. basis

## 2011-10-18 ENCOUNTER — Other Ambulatory Visit: Payer: Self-pay

## 2011-10-18 ENCOUNTER — Encounter (HOSPITAL_COMMUNITY): Payer: Self-pay | Admitting: *Deleted

## 2011-10-18 ENCOUNTER — Emergency Department (HOSPITAL_COMMUNITY)
Admission: EM | Admit: 2011-10-18 | Discharge: 2011-10-18 | Disposition: A | Discharge status: Home / Self Care | Payer: Self-pay | Attending: Emergency Medicine | Admitting: Emergency Medicine

## 2011-10-18 ENCOUNTER — Emergency Department (HOSPITAL_COMMUNITY): Payer: Self-pay

## 2011-10-18 DIAGNOSIS — J45909 Unspecified asthma, uncomplicated: Secondary | ICD-10-CM | POA: Insufficient documentation

## 2011-10-18 DIAGNOSIS — F3289 Other specified depressive episodes: Secondary | ICD-10-CM | POA: Insufficient documentation

## 2011-10-18 DIAGNOSIS — R0781 Pleurodynia: Secondary | ICD-10-CM

## 2011-10-18 DIAGNOSIS — F909 Attention-deficit hyperactivity disorder, unspecified type: Secondary | ICD-10-CM | POA: Insufficient documentation

## 2011-10-18 DIAGNOSIS — F329 Major depressive disorder, single episode, unspecified: Secondary | ICD-10-CM | POA: Insufficient documentation

## 2011-10-18 DIAGNOSIS — R0602 Shortness of breath: Secondary | ICD-10-CM | POA: Insufficient documentation

## 2011-10-18 DIAGNOSIS — R071 Chest pain on breathing: Secondary | ICD-10-CM | POA: Insufficient documentation

## 2011-10-18 DIAGNOSIS — R05 Cough: Secondary | ICD-10-CM | POA: Insufficient documentation

## 2011-10-18 DIAGNOSIS — R059 Cough, unspecified: Secondary | ICD-10-CM | POA: Insufficient documentation

## 2011-10-18 LAB — DIFFERENTIAL
Basophils Absolute: 0 10*3/uL (ref 0.0–0.1)
Basophils Relative: 1 % (ref 0–1)
Eosinophils Relative: 1 % (ref 0–5)
Monocytes Absolute: 0.5 10*3/uL (ref 0.1–1.0)

## 2011-10-18 LAB — CBC
HCT: 42.1 % (ref 39.0–52.0)
MCH: 27.9 pg (ref 26.0–34.0)
MCHC: 33.5 g/dL (ref 30.0–36.0)
MCV: 83.4 fL (ref 78.0–100.0)
RDW: 12.7 % (ref 11.5–15.5)

## 2011-10-18 LAB — COMPREHENSIVE METABOLIC PANEL
AST: 16 U/L (ref 0–37)
Albumin: 4.1 g/dL (ref 3.5–5.2)
Calcium: 9.2 mg/dL (ref 8.4–10.5)
Creatinine, Ser: 0.89 mg/dL (ref 0.50–1.35)
GFR calc non Af Amer: >90 mL/min (ref >90)

## 2011-10-18 MED ORDER — IBUPROFEN 200 MG PO TABS: 600.0000 mg | ORAL_TABLET | Freq: Three times a day (TID) | ORAL | Status: DC | PRN

## 2011-10-18 MED ORDER — KETOROLAC TROMETHAMINE 30 MG/ML IJ SOLN
30.0000 mg | Freq: Once | INTRAMUSCULAR | Status: AC
  Administered 2011-10-18: 30 mg via INTRAVENOUS
  Filled 2011-10-18: qty 1

## 2011-10-18 NOTE — ED Provider Notes (Signed)
21 year old male had sudden onset of sharp anterior chest pain at about 7 PM. He is about 2 weeks status post laparoscopic surgery for a hiatus hernia. Pain is worse with movement, worse with palpation, and worse with deep breathing. On exam, there is marked tenderness of the anterior chest wall. His surgical scar is healed well. This most likely is musculoskeletal chest pain at. However, due to recent surgery, he should have a d-dimer to rule out pulmonary embolism. He'll be given a dose of ketorolac for pain and reevaluated.  Dione Booze, MD 10/18/11 2037

## 2011-10-18 NOTE — ED Notes (Signed)
Discharge teaching done by resident MD

## 2011-10-18 NOTE — ED Notes (Signed)
Lab stated they lost some of pt's blood. Lab was unable to state exactly where the blood might be and how the lab lost the blood. Pt will require a new lab draw and delay in lab results. Consulting civil engineer notified

## 2011-10-18 NOTE — ED Provider Notes (Signed)
History     CSN: [id number]  Arrival date & time 10/18/11  1924   First MD Initiated Contact with Patient 10/18/11 1933     CC: chest pain  (Consider location/radiation/quality/duration/timing/severity/associated sxs/prior Treatment)  HPI  Patient is 21 yo man with PMH of depression, asthma and s/p recent ventral hernia surgery, who presents with chest pain. Patient started having sudden onset chest pain when was sitting in the church this evening. His chest pain is located at his mid and left chest, 10/10 in severity, constant, sharp, non-radiating. It is aggravated by deep breath and alleviated by holding his breath. It is associated with dry cough and SOB, but not with palpitation. His chest pain is not worse with exertion. Patient dose not have fever or chills. He does not have pain, swelling or redness over his calf areas.  Denies headaches, abdominal pain,diarrhea, constipation, dysuria, urgency, frequency, hematuria, joint pain or leg swelling.  Past Medical History  Diagnosis Date  . Depression 07/07/2011  . Suicide attempt 07/07/2011  . ADHD (attention deficit hyperactivity disorder) 07/07/2011  . Chlamydia infection 07/07/2011  . History of chickenpox   . Positive TB test   . Hernia   . Asthma     takes albuterol  . Asthma 07/07/2011  . Bronchitis 2008    Past Surgical History  Procedure Date  . Wisdom tooth extraction 2008  . Ventral hernia repair 09/20/2011    Procedure: HERNIA REPAIR VENTRAL ADULT;  Surgeon: Rulon Abide, DO;  Location: [facility] OR;  Service: General;  Laterality: N/A;   with possible mesh    Family History  Problem Relation Age of Onset  . Arthritis Other     Grandparent  . Hypertension Other     Parent, Grandparent  . Anesthesia problems Father   . Hypotension Neg Hx   . Malignant hyperthermia Neg Hx   . Pseudochol deficiency Neg Hx     History  Substance Use Topics  . Smoking status: Never Smoker   . Smokeless tobacco: Never Used    . Alcohol Use: Yes     Social drink    Review of Systems  Constitutional: Negative for fever, chills, diaphoresis and fatigue.  HENT: Negative for ear pain, nosebleeds, congestion, sore throat, rhinorrhea, drooling, neck pain, voice change, postnasal drip, sinus pressure and ear discharge.   Eyes: Negative for photophobia, pain, itching and visual disturbance.  Respiratory: Positive for cough and shortness of breath. Negative for apnea, choking, chest tightness, wheezing and stridor.   Cardiovascular: Positive for chest pain. Negative for palpitations and leg swelling.  Gastrointestinal: Negative for nausea, vomiting, abdominal pain, diarrhea, constipation, blood in stool and abdominal distention.  Genitourinary: Negative for dysuria, urgency, frequency and hematuria.  Musculoskeletal: Negative for myalgias, back pain, joint swelling, arthralgias and gait problem.  Skin: Negative for rash and wound.  Neurological: Negative for dizziness, tremors, seizures, syncope, facial asymmetry, weakness, light-headedness, numbness and headaches.  Hematological: Negative for adenopathy.  Psychiatric/Behavioral: Negative for hallucinations, behavioral problems, confusion and agitation.    Allergies  Review of patient's allergies indicates no known allergies.  Home Medications   Current Outpatient Rx  Name Route Sig Dispense Refill  . ALBUTEROL SULFATE HFA 108 (90 BASE) MCG/ACT IN AERS Inhalation Inhale 2 puffs into the lungs every 6 (six) hours as needed. For shortness of breath      BP 126/61  Pulse 59  Temp(Src) 98.4 F (36.9 C) (Oral)  Resp 17  Ht 6\' 4"  (1.93 m)  Wt 201  lb (91.173 kg)  BMI 24.47 kg/m2  SpO2 100%  Physical Exam  General: in moderate acute distress.  HEENT: PERRL, EOMI, no scleral icterus Cardiac: S1/S2, RRR, No murmurs, gallops or rubs Pulm: Good air movement bilaterally, Clear to auscultation bilaterally, No rales, wheezing, rhonchi or rubs. Chest wall: there is  tenderness over his mid and left chest wall. Abd: Soft,  nondistended, nontender, no rebound pain, no organomegaly, BS present Ext: No rashes or edema, 2+DP/PT pulse bilaterally Neuro: alert and oriented X3, cranial nerves II-XII grossly intact, muscle strength 5/5 in all extremeties,  sensation to light touch intact.    Date: 10/18/2011  Rate: 78  Rhythm: normal sinus rhythm  QRS Axis: normal  Intervals: normal  ST/T Wave abnormalities: nonspecific ST changes  Conduction Disutrbances:none  Narrative Interpretation:   Old EKG Reviewed: changes noted. There is T wave inversion in V4  ED Course  Procedures (including critical care time)  Patient's chest pain is pleuritic. It is likely to be musculoskeletal pain or costochondritis. But patient had recent surgery putting him at risk of getting PE. It is unlikely that patient has ACS due to lack of risk factors.  Patient has negative D-dimer ruling out PE. His CBC and CMP are normal. His CXR showed mild cardiomegaly and mild pulmonary vascular congestion. It could be due to poor inspiration on portable x-ray.   Patient's vital sign is stable. His Bp is 126/61 mmHg and HR is 62/min currently (at 10:29 PM). He still has chest pain which remains unchanged at ED. Patient will be discharged home on ibuprofen for musculoskeletal pain. Patient is instructed to come back if his symptoms get worse.   Labs Reviewed  CBC  DIFFERENTIAL  COMPREHENSIVE METABOLIC PANEL  D-DIMER, QUANTITATIVE   Dg Chest Port 1 View  10/18/2011  *RADIOLOGY REPORT*  Clinical Data: Chest pain.  PORTABLE CHEST - 1 VIEW  Comparison: 04/05/2011.  Findings: Poor inspiration.  Mildly enlarged cardiac silhouette and mildly prominent pulmonary vasculature.  Clear lungs.  Unremarkable bones.  IMPRESSION: Mild cardiomegaly and mild pulmonary vascular congestion.  Original Report Authenticated By: Darrol Angel, M.D.     No diagnosis found.    MDM          Lorretta Harp,  MD 10/18/11 2231

## 2011-10-18 NOTE — Discharge Instructions (Signed)
1. Please follow-up with your PCP in 2 weeks. 2. If you have worsening of your symptoms or new symptoms arise, please go to the ER immediately if symptoms are severe. 3.  Chest Pain (Nonspecific) Chest pain has many causes. Your pain could be caused by something serious, such as a heart attack or a blood clot in the lungs. It could also be caused by something less serious, such as a chest bruise or a virus. Follow up with your doctor. More lab tests or other studies may be needed to find the cause of your pain. Most of the time, nonspecific chest pain will improve within 2 to 3 days of rest and mild pain medicine. HOME CARE For chest bruises, you may put ice on the sore area for 15 to 20 minutes, 3 to 4 times a day. Do this only if it makes you or your child feel better.  Put ice in a plastic bag.  Place a towel between the skin and the bag.  Rest for the next 2 to 3 days.  Go back to work if the pain improves.  See your doctor if the pain lasts longer than 1 to 2 weeks.  Only take medicine as told by your doctor.  Quit smoking if you smoke.  GET HELP RIGHT AWAY IF:  There is more pain or pain that spreads to the arm, neck, jaw, back, or belly (abdomen).  You or your child has shortness of breath.  You or your child coughs more than usual or coughs up blood.  You or your child has very bad back or belly pain, feels sick to his or her stomach (nauseous), or throws up (vomits).  You or your child has very bad weakness.  You or your child passes out (faints).  You or your child has a temperature by mouth above 102 F (38.9 C), not controlled by medicine.  Any of these problems may be serious and may be an emergency. Do not wait to see if the problems will go away. Get medical help right away. Call your local emergency services 911 in [place] Do not drive yourself to the hospital. MAKE SURE YOU:  Understand these instructions.  Will watch this condition.  Will get help right away if you or your  child is not doing well or gets worse.  Document Released: 12/26/2007 Document Revised: 06/28/2011 Document Reviewed: 12/26/2007 [facility] Patient Information 2012 [place], [place].

## 2011-10-18 NOTE — ED Notes (Signed)
Pt informed this Clinical research associate that he "felt dizzy" while ambulating in hallway to restroom. Pt given assistance to restroom and back to room. Hooked back up to monitor. Nurse notified

## 2011-10-20 NOTE — ED Provider Notes (Signed)
I saw and evaluated the patient, reviewed the resident's note and I agree with the findings and plan.   Dione Booze, MD 10/20/11 2330

## 2011-10-25 ENCOUNTER — Encounter (HOSPITAL_COMMUNITY): Payer: Self-pay | Admitting: Emergency Medicine

## 2011-10-25 ENCOUNTER — Emergency Department (HOSPITAL_COMMUNITY): Payer: Self-pay

## 2011-10-25 ENCOUNTER — Emergency Department (HOSPITAL_COMMUNITY)
Admission: EM | Admit: 2011-10-25 | Discharge: 2011-10-25 | Disposition: A | Discharge status: Home / Self Care | Payer: Self-pay | Attending: Emergency Medicine | Admitting: Emergency Medicine

## 2011-10-25 DIAGNOSIS — M79609 Pain in unspecified limb: Secondary | ICD-10-CM | POA: Insufficient documentation

## 2011-10-25 DIAGNOSIS — J45909 Unspecified asthma, uncomplicated: Secondary | ICD-10-CM | POA: Insufficient documentation

## 2011-10-25 DIAGNOSIS — W219XXA Striking against or struck by unspecified sports equipment, initial encounter: Secondary | ICD-10-CM | POA: Insufficient documentation

## 2011-10-25 DIAGNOSIS — F3289 Other specified depressive episodes: Secondary | ICD-10-CM | POA: Insufficient documentation

## 2011-10-25 DIAGNOSIS — IMO0001 Reserved for inherently not codable concepts without codable children: Secondary | ICD-10-CM | POA: Insufficient documentation

## 2011-10-25 DIAGNOSIS — R599 Enlarged lymph nodes, unspecified: Secondary | ICD-10-CM | POA: Insufficient documentation

## 2011-10-25 DIAGNOSIS — F909 Attention-deficit hyperactivity disorder, unspecified type: Secondary | ICD-10-CM | POA: Insufficient documentation

## 2011-10-25 DIAGNOSIS — S6390XA Sprain of unspecified part of unspecified wrist and hand, initial encounter: Secondary | ICD-10-CM | POA: Insufficient documentation

## 2011-10-25 DIAGNOSIS — F329 Major depressive disorder, single episode, unspecified: Secondary | ICD-10-CM | POA: Insufficient documentation

## 2011-10-25 DIAGNOSIS — Y9367 Activity, basketball: Secondary | ICD-10-CM | POA: Insufficient documentation

## 2011-10-25 NOTE — Discharge Instructions (Signed)
.  Finger Sprain A finger sprain happens when the bands of tissue that hold the finger bones together (ligaments) stretch too much and tear. HOME CARE  Keep your injured finger raised (elevated) when possible.   Put ice on the injured area, twice a day, for 2 to 3 days.   Put ice in a plastic bag.   Place a towel between your skin and the bag.   Leave the ice on for 15 minutes.   Only take medicine as told by your doctor.   Do not wear rings on the injured finger.   Protect your finger until pain and stiffness go away (usually 3 to 4 weeks).      Your doctor may suggest special exercises for you to do. These exercises will help keep or stop stiffness from happening.  GET HELP RIGHT AWAY IF:     Your pain gets worse, not better.  MAKE SURE YOU:  Understand these instructions.   Will watch your condition.   Will get help right away if you are not doing well or get worse.  Document Released: 08/11/2010 Document Revised: 06/28/2011 Document Reviewed: 03/12/2011 [facility] Patient Information 2012 [place], [place].

## 2011-10-25 NOTE — ED Notes (Signed)
PT. REPORTS INJURY TO RIGHT MIDDLE FINGER THIS EVENING WHILE PLAYING BASKETBALL .

## 2011-10-25 NOTE — ED Provider Notes (Signed)
Medical screening examination/treatment/procedure(s) were performed by non-physician practitioner and as supervising physician I was immediately available for consultation/collaboration.   Leigh-Ann Lasonja Lakins, MD 10/25/11 2334 

## 2011-10-25 NOTE — ED Notes (Signed)
Pt st's he was playing basketball earlier today and injured right middle finger.  Pt c/o pain and swelling to finger.

## 2011-10-25 NOTE — ED Provider Notes (Signed)
History     CSN: [id number]  Arrival date & time 10/25/11  2131   First MD Initiated Contact with Patient 10/25/11 2254      Chief Complaint  Patient presents with  . Finger Injury    (Consider location/radiation/quality/duration/timing/severity/associated sxs/prior treatment) Patient is a 21 y.o. male presenting with hand pain. The history is provided by the patient. No language interpreter was used.  Hand Pain This is a new problem. The current episode started today. The problem occurs intermittently. The problem has been unchanged. Associated symptoms include arthralgias. The symptoms are aggravated by bending. He has tried nothing for the symptoms.  Patient playing basketball earlier today, jammed middle finger of right hand.  Past Medical History  Diagnosis Date  . Depression 07/07/2011  . Suicide attempt 07/07/2011  . ADHD (attention deficit hyperactivity disorder) 07/07/2011  . Chlamydia infection 07/07/2011  . History of chickenpox   . Positive TB test   . Hernia   . Asthma     takes albuterol  . Asthma 07/07/2011  . Bronchitis 2008    Past Surgical History  Procedure Date  . Wisdom tooth extraction 2008  . Ventral hernia repair 09/20/2011    Procedure: HERNIA REPAIR VENTRAL ADULT;  Surgeon: Rulon Abide, DO;  Location: [facility] OR;  Service: General;  Laterality: N/A;   with possible mesh    Family History  Problem Relation Age of Onset  . Arthritis Other     Grandparent  . Hypertension Other     Parent, Grandparent  . Anesthesia problems Father   . Hypotension Neg Hx   . Malignant hyperthermia Neg Hx   . Pseudochol deficiency Neg Hx     History  Substance Use Topics  . Smoking status: Never Smoker   . Smokeless tobacco: Never Used  . Alcohol Use: Yes     Social drink      Review of Systems  Musculoskeletal: Positive for arthralgias.  All other systems reviewed and are negative.    Allergies  Review of patient's allergies indicates no  known allergies.  Home Medications  No current outpatient prescriptions on file.  BP 124/56  Pulse 96  Temp(Src) 98.1 F (36.7 C) (Oral)  Resp 18  SpO2 100%  Physical Exam  Nursing note and vitals reviewed. Constitutional: He is oriented to person, place, and time. He appears well-developed and well-nourished. No distress.  HENT:  Head: Normocephalic.  Eyes: Conjunctivae are normal. Pupils are equal, round, and reactive to light.  Neck: Normal range of motion. Neck supple.  Cardiovascular: Normal rate, regular rhythm, normal heart sounds and intact distal pulses.   Pulmonary/Chest: Effort normal and breath sounds normal.  Musculoskeletal: Normal range of motion. He exhibits tenderness.  Lymphadenopathy:    He has cervical adenopathy.  Neurological: He is alert and oriented to person, place, and time.  Skin: Skin is warm and dry.  Psychiatric: He has a normal mood and affect. His behavior is normal. Judgment and thought content normal.    ED Course  Procedures (including critical care time)  Labs Reviewed - No data to display Dg Finger Middle Right  10/25/2011  *RADIOLOGY REPORT*  Clinical Data: Injured long finger playing baseball.  RIGHT MIDDLE FINGER 2+V  Comparison: 03/11/2011.  Findings: The joint spaces are maintained.  No acute fracture.  IMPRESSION: No acute bony findings.  Original Report Authenticated By: P. Loralie Champagne, M.D.     No diagnosis found.   Finger sprain MDM  Jimmye Norman, NP 10/25/11 [phone]

## 2012-05-22 ENCOUNTER — Encounter (HOSPITAL_COMMUNITY): Payer: Self-pay | Admitting: *Deleted

## 2012-05-22 ENCOUNTER — Emergency Department (HOSPITAL_COMMUNITY)
Admission: EM | Admit: 2012-05-22 | Discharge: 2012-05-22 | Disposition: A | Discharge status: Home / Self Care | Payer: Self-pay | Attending: Emergency Medicine | Admitting: Emergency Medicine

## 2012-05-22 DIAGNOSIS — Y929 Unspecified place or not applicable: Secondary | ICD-10-CM | POA: Insufficient documentation

## 2012-05-22 DIAGNOSIS — Z872 Personal history of diseases of the skin and subcutaneous tissue: Secondary | ICD-10-CM | POA: Insufficient documentation

## 2012-05-22 DIAGNOSIS — Y939 Activity, unspecified: Secondary | ICD-10-CM | POA: Insufficient documentation

## 2012-05-22 DIAGNOSIS — W19XXXA Unspecified fall, initial encounter: Secondary | ICD-10-CM

## 2012-05-22 DIAGNOSIS — L089 Local infection of the skin and subcutaneous tissue, unspecified: Secondary | ICD-10-CM | POA: Insufficient documentation

## 2012-05-22 DIAGNOSIS — W010XXA Fall on same level from slipping, tripping and stumbling without subsequent striking against object, initial encounter: Secondary | ICD-10-CM | POA: Insufficient documentation

## 2012-05-22 DIAGNOSIS — F909 Attention-deficit hyperactivity disorder, unspecified type: Secondary | ICD-10-CM | POA: Insufficient documentation

## 2012-05-22 DIAGNOSIS — Z8659 Personal history of other mental and behavioral disorders: Secondary | ICD-10-CM | POA: Insufficient documentation

## 2012-05-22 DIAGNOSIS — J45909 Unspecified asthma, uncomplicated: Secondary | ICD-10-CM | POA: Insufficient documentation

## 2012-05-22 DIAGNOSIS — T148XXA Other injury of unspecified body region, initial encounter: Secondary | ICD-10-CM | POA: Insufficient documentation

## 2012-05-22 NOTE — ED Provider Notes (Signed)
History     CSN: [id number]  Arrival date & time 05/22/12  1191   First MD Initiated Contact with Patient 05/22/12 0930      Chief Complaint  Patient presents with  . Fall    (Consider location/radiation/quality/duration/timing/severity/associated sxs/prior treatment) HPI Comments: Patient reports that this morning he slipped and fell on the concrete and sustained abrasions to his right knee, left elbow, right thumb, and right knee.  Bleeding controlled at this time.  He did not hit his head.  He denies back pain or neck pain.  Full ROM of all extremities.  No areas of swelling.  He was ambulatory after the fall.  Patient is a 21 y.o. male presenting with fall. The history is provided by the patient.  Fall Pertinent negatives include no headaches.    Past Medical History  Diagnosis Date  . Depression 07/07/2011  . Suicide attempt 07/07/2011  . ADHD (attention deficit hyperactivity disorder) 07/07/2011  . Chlamydia infection 07/07/2011  . History of chickenpox   . Positive TB test   . Hernia   . Asthma     takes albuterol  . Asthma 07/07/2011  . Bronchitis 2008    Past Surgical History  Procedure Date  . Wisdom tooth extraction 2008  . Ventral hernia repair 09/20/2011    Procedure: HERNIA REPAIR VENTRAL ADULT;  Surgeon: Rulon Abide, DO;  Location: [facility] OR;  Service: General;  Laterality: N/A;   with possible mesh    Family History  Problem Relation Age of Onset  . Arthritis Other     Grandparent  . Hypertension Other     Parent, Grandparent  . Anesthesia problems Father   . Hypotension Neg Hx   . Malignant hyperthermia Neg Hx   . Pseudochol deficiency Neg Hx     History  Substance Use Topics  . Smoking status: Never Smoker   . Smokeless tobacco: Never Used  . Alcohol Use: Yes     Social drink      Review of Systems  HENT: Negative for neck pain.   Musculoskeletal: Negative for back pain, joint swelling and gait problem.  Skin:       Several  abrasions  Neurological: Negative for dizziness, syncope, light-headedness and headaches.  Psychiatric/Behavioral: Negative for confusion.    Allergies  Review of patient's allergies indicates no known allergies.  Home Medications  No current outpatient prescriptions on file.  BP 131/70  Pulse 67  Temp 98 F (36.7 C) (Oral)  Resp 14  SpO2 99%  Physical Exam  Nursing note and vitals reviewed. Constitutional: He appears well-developed and well-nourished. No distress.  HENT:  Head: Normocephalic and atraumatic.  Neck: Normal range of motion. Neck supple.  Cardiovascular: Normal rate, regular rhythm and normal heart sounds.   Pulmonary/Chest: Effort normal and breath sounds normal.  Musculoskeletal: Normal range of motion. He exhibits no edema and no tenderness.  Neurological: He is alert. He has normal strength. No cranial nerve deficit or sensory deficit. Gait normal.  Skin: Skin is warm. Abrasion noted. He is not diaphoretic.     Psychiatric: He has a normal mood and affect.    ED Course  Procedures (including critical care time)  Labs Reviewed - No data to display No results found.   No diagnosis found.    MDM  Patient presents after a fall on concrete just prior to arrival.  He has full ROM of all of his extremities.  He is able to ambulate without difficulty.  Therefore,  xrays not indicated at this time.  Abrasions superficial.  Abrasions all cleaned well and bacitracin applied to the areas.          Pascal Lux Copeland, PA-C 05/22/12 1740

## 2012-05-22 NOTE — ED Notes (Signed)
Pt reports slipping and falling on concrete this am. C/o pain and abrasions to LLQ, L elbow, R thumb, R knee.

## 2012-05-23 NOTE — ED Provider Notes (Signed)
Medical screening examination/treatment/procedure(s) were performed by non-physician practitioner and as supervising physician I was immediately available for consultation/collaboration.  Flint Melter, MD 05/23/12 [phone]

## 2012-07-14 ENCOUNTER — Encounter: Payer: PRIVATE HEALTH INSURANCE | Admitting: Internal Medicine

## 2012-07-14 DIAGNOSIS — Z0289 Encounter for other administrative examinations: Secondary | ICD-10-CM

## 2012-10-21 ENCOUNTER — Emergency Department (HOSPITAL_COMMUNITY)
Admission: EM | Admit: 2012-10-21 | Discharge: 2012-10-22 | Disposition: A | Discharge status: Home / Self Care | Payer: Self-pay | Attending: Emergency Medicine | Admitting: Emergency Medicine

## 2012-10-21 ENCOUNTER — Encounter (HOSPITAL_COMMUNITY): Payer: Self-pay

## 2012-10-21 DIAGNOSIS — Z8619 Personal history of other infectious and parasitic diseases: Secondary | ICD-10-CM | POA: Insufficient documentation

## 2012-10-21 DIAGNOSIS — J45909 Unspecified asthma, uncomplicated: Secondary | ICD-10-CM | POA: Insufficient documentation

## 2012-10-21 DIAGNOSIS — F4320 Adjustment disorder, unspecified: Secondary | ICD-10-CM | POA: Insufficient documentation

## 2012-10-21 DIAGNOSIS — Z8659 Personal history of other mental and behavioral disorders: Secondary | ICD-10-CM | POA: Insufficient documentation
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
## 2012-10-21 NOTE — ED Notes (Signed)
Per [employer], previous commitment in past to behavioral health.

## 2012-10-21 NOTE — ED Notes (Signed)
Pt given scrubs and instructed to change.  

## 2012-10-21 NOTE — ED Notes (Signed)
Pt is aware of the need for urine sample, however states he is unable to at this time.

## 2012-10-21 NOTE — ED Notes (Signed)
Pt. and belongings wanded by security 

## 2012-10-22 LAB — COMPREHENSIVE METABOLIC PANEL
ALT: 20 U/L (ref 0–53)
AST: 30 U/L (ref 0–37)
Alkaline Phosphatase: 48 U/L (ref 39–117)
Calcium: 9.9 mg/dL (ref 8.4–10.5)
Glucose, Bld: 90 mg/dL (ref 70–99)
Potassium: 3.3 mEq/L — ABNORMAL LOW (ref 3.5–5.1)
Sodium: 135 mEq/L (ref 135–145)
Total Protein: 7.5 g/dL (ref 6.0–8.3)

## 2012-10-22 LAB — RAPID URINE DRUG SCREEN, HOSP PERFORMED
Amphetamines: NOT DETECTED
Barbiturates: NOT DETECTED
Benzodiazepines: NOT DETECTED
Cocaine: NOT DETECTED
Opiates: NOT DETECTED
Tetrahydrocannabinol: NOT DETECTED

## 2012-10-22 LAB — CBC
HCT: 46.5 % (ref 39.0–52.0)
Hemoglobin: 15.8 g/dL (ref 13.0–17.0)
MCH: 27.9 pg (ref 26.0–34.0)
MCHC: 34 g/dL (ref 30.0–36.0)
MCV: 82.2 fL (ref 78.0–100.0)
Platelets: 314 10*3/uL (ref 150–400)
RBC: 5.66 MIL/uL (ref 4.22–5.81)
RDW: 12.5 % (ref 11.5–15.5)
WBC: 8.6 10*3/uL (ref 4.0–10.5)

## 2012-10-22 NOTE — ED Notes (Signed)
TelePsych papers faxed to [facility].

## 2012-10-22 NOTE — ED Notes (Signed)
Tele Psych in progress 

## 2012-10-22 NOTE — ED Notes (Signed)
Tele Psych completed

## 2012-10-22 NOTE — ED Notes (Signed)
Patient watching TV. Patient remains cooperative at this time.
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
## 2012-10-22 NOTE — ED Notes (Signed)
Patient awaiting TelePsych.

## 2012-10-22 NOTE — ED Notes (Signed)
Spoke with Ronaldo Miyamoto at [facility] to notify of request for new consult.

## 2012-10-22 NOTE — ED Provider Notes (Signed)
Medical screening examination/treatment/procedure(s) were performed by non-physician practitioner and as supervising physician I was immediately available for consultation/collaboration.  Shelda Jakes, MD 10/22/12 [phone]

## 2012-12-02 ENCOUNTER — Encounter (HOSPITAL_COMMUNITY): Payer: Self-pay

## 2012-12-02 ENCOUNTER — Emergency Department (HOSPITAL_COMMUNITY)
Admission: EM | Admit: 2012-12-02 | Discharge: 2012-12-02 | Disposition: A | Discharge status: Home / Self Care | Payer: Self-pay | Attending: Emergency Medicine | Admitting: Emergency Medicine

## 2012-12-02 DIAGNOSIS — F329 Major depressive disorder, single episode, unspecified: Secondary | ICD-10-CM | POA: Insufficient documentation

## 2012-12-02 DIAGNOSIS — J029 Acute pharyngitis, unspecified: Secondary | ICD-10-CM | POA: Insufficient documentation

## 2012-12-02 DIAGNOSIS — F909 Attention-deficit hyperactivity disorder, unspecified type: Secondary | ICD-10-CM | POA: Insufficient documentation

## 2012-12-02 DIAGNOSIS — J45909 Unspecified asthma, uncomplicated: Secondary | ICD-10-CM | POA: Insufficient documentation

## 2012-12-02 DIAGNOSIS — Z8611 Personal history of tuberculosis: Secondary | ICD-10-CM | POA: Insufficient documentation

## 2012-12-02 DIAGNOSIS — Z8619 Personal history of other infectious and parasitic diseases: Secondary | ICD-10-CM | POA: Insufficient documentation

## 2012-12-02 DIAGNOSIS — R509 Fever, unspecified: Secondary | ICD-10-CM | POA: Insufficient documentation

## 2012-12-02 DIAGNOSIS — R51 Headache: Secondary | ICD-10-CM | POA: Insufficient documentation

## 2012-12-02 DIAGNOSIS — F3289 Other specified depressive episodes: Secondary | ICD-10-CM | POA: Insufficient documentation

## 2012-12-02 DIAGNOSIS — Z8719 Personal history of other diseases of the digestive system: Secondary | ICD-10-CM | POA: Insufficient documentation

## 2012-12-02 DIAGNOSIS — R599 Enlarged lymph nodes, unspecified: Secondary | ICD-10-CM | POA: Insufficient documentation

## 2012-12-02 MED ORDER — PENICILLIN V POTASSIUM 500 MG PO TABS: 500.0000 mg | ORAL_TABLET | Freq: Three times a day (TID) | ORAL | Status: DC

## 2012-12-02 NOTE — ED Notes (Signed)
Pt c/o headache, sore throat and congestion x 3 days.  Pain score 8/10.  NAD noted.

## 2012-12-02 NOTE — ED Provider Notes (Signed)
History     CSN: [id number]  Arrival date & time 12/02/12  4098   First MD Initiated Contact with Patient 12/02/12 [phone]      Chief Complaint  Patient presents with  . Headache  . Sore Throat  . Nasal Congestion    (Consider location/radiation/quality/duration/timing/severity/associated sxs/prior treatment) HPI Patient complaining of sore throat for three days.  Subjective fever with swollen glands.  Some headache,  Denies cough or nasal congestion.  No known sick contacts.  No intervention.   Past Medical History  Diagnosis Date  . Depression 07/07/2011  . Suicide attempt 07/07/2011  . ADHD (attention deficit hyperactivity disorder) 07/07/2011  . Chlamydia infection 07/07/2011  . History of chickenpox   . Positive TB test   . Hernia   . Asthma     takes albuterol  . Asthma 07/07/2011  . Bronchitis 2008    Past Surgical History  Procedure Laterality Date  . Wisdom tooth extraction  2008  . Ventral hernia repair  09/20/2011    Procedure: HERNIA REPAIR VENTRAL ADULT;  Surgeon: Rulon Abide, DO;  Location: [facility] OR;  Service: General;  Laterality: N/A;   with possible mesh    Family History  Problem Relation Age of Onset  . Arthritis Other     Grandparent  . Hypertension Other     Parent, Grandparent  . Anesthesia problems Father   . Hypotension Neg Hx   . Malignant hyperthermia Neg Hx   . Pseudochol deficiency Neg Hx     History  Substance Use Topics  . Smoking status: Never Smoker   . Smokeless tobacco: Never Used  . Alcohol Use: Yes     Comment: Social drink      Review of Systems  All other systems reviewed and are negative.    Allergies  Review of patient's allergies indicates no known allergies.  Home Medications   Current Outpatient Rx  Name  Route  Sig  Dispense  Refill  . ibuprofen (ADVIL,MOTRIN) 200 MG tablet   Oral   Take 200 mg by mouth every 6 (six) hours as needed for pain.           BP 141/71  Pulse 65  Temp(Src) 98.3  F (36.8 C) (Oral)  Resp 16  Ht 6\' 4"  (1.93 m)  Wt 200 lb (90.719 kg)  BMI 24.35 kg/m2  SpO2 98%  Physical Exam  Nursing note and vitals reviewed. Constitutional: He is oriented to person, place, and time. He appears well-developed and well-nourished.  HENT:  Head: Normocephalic and atraumatic.  Right Ear: External ear normal.  Left Ear: External ear normal.  Nose: Nose normal.  Mouth/Throat: Mucous membranes are normal. Posterior oropharyngeal erythema present. No oropharyngeal exudate, posterior oropharyngeal edema or tonsillar abscesses.  Eyes: Conjunctivae are normal. Pupils are equal, round, and reactive to light.  Cardiovascular: Normal rate and regular rhythm.   Pulmonary/Chest: Effort normal and breath sounds normal.  Abdominal: Soft. Bowel sounds are normal.  Musculoskeletal: Normal range of motion.  Lymphadenopathy:    He has cervical adenopathy.  Neurological: He is alert and oriented to person, place, and time.  Skin: Skin is warm and dry. He is not diaphoretic.  Psychiatric: He has a normal mood and affect.    ED Course  Procedures (including critical care time)  Labs Reviewed - No data to display No results found.   No diagnosis found.    Clinical exam c.w. Strep and will treat with pcn.  Hilario Quarry, MD 12/02/12 [phone]

## 2012-12-03 ENCOUNTER — Emergency Department (HOSPITAL_COMMUNITY)
Admission: EM | Admit: 2012-12-03 | Discharge: 2012-12-03 | Disposition: A | Discharge status: Home / Self Care | Payer: Self-pay | Attending: Emergency Medicine | Admitting: Emergency Medicine

## 2012-12-03 DIAGNOSIS — J45909 Unspecified asthma, uncomplicated: Secondary | ICD-10-CM | POA: Insufficient documentation

## 2012-12-03 DIAGNOSIS — J3489 Other specified disorders of nose and nasal sinuses: Secondary | ICD-10-CM | POA: Insufficient documentation

## 2012-12-03 DIAGNOSIS — R51 Headache: Secondary | ICD-10-CM | POA: Insufficient documentation

## 2012-12-03 DIAGNOSIS — Z8659 Personal history of other mental and behavioral disorders: Secondary | ICD-10-CM | POA: Insufficient documentation

## 2012-12-03 DIAGNOSIS — Z8619 Personal history of other infectious and parasitic diseases: Secondary | ICD-10-CM | POA: Insufficient documentation

## 2012-12-03 DIAGNOSIS — J029 Acute pharyngitis, unspecified: Secondary | ICD-10-CM | POA: Insufficient documentation

## 2012-12-03 DIAGNOSIS — K117 Disturbances of salivary secretion: Secondary | ICD-10-CM | POA: Insufficient documentation

## 2012-12-03 DIAGNOSIS — Z8709 Personal history of other diseases of the respiratory system: Secondary | ICD-10-CM | POA: Insufficient documentation

## 2012-12-03 MED ORDER — HYDROCODONE-ACETAMINOPHEN 7.5-325 MG/15ML PO SOLN: 10.0000 mL | Freq: Four times a day (QID) | ORAL | Status: DC | PRN

## 2012-12-03 MED ORDER — CEPASTAT 14.5 MG MT LOZG: 1.0000 | LOZENGE | OROMUCOSAL | Status: DC | PRN

## 2012-12-03 NOTE — ED Provider Notes (Signed)
History     CSN: [id number]  Arrival date & time 12/03/12  0045   None     No chief complaint on file.   (Consider location/radiation/quality/duration/timing/severity/associated sxs/prior treatment) HPI HX per patient - evaluated here earlier for sore throat x 3 days, was given ABx and discharge home with Dx pharyngitis. He returns with persistent sore throat, hurts to swallow and concern that his throat may close up. No rash , no wheezing, no h/o allergic reaction or anaphylaxis. Symptoms MOD in severity. He is able to swallow liquids.    Past Medical History  Diagnosis Date  . Depression 07/07/2011  . Suicide attempt 07/07/2011  . ADHD (attention deficit hyperactivity disorder) 07/07/2011  . Chlamydia infection 07/07/2011  . History of chickenpox   . Positive TB test   . Hernia   . Asthma     takes albuterol  . Asthma 07/07/2011  . Bronchitis 2008    Past Surgical History  Procedure Laterality Date  . Wisdom tooth extraction  2008  . Ventral hernia repair  09/20/2011    Procedure: HERNIA REPAIR VENTRAL ADULT;  Surgeon: Rulon Abide, DO;  Location: [facility] OR;  Service: General;  Laterality: N/A;   with possible mesh    Family History  Problem Relation Age of Onset  . Arthritis Other     Grandparent  . Hypertension Other     Parent, Grandparent  . Anesthesia problems Father   . Hypotension Neg Hx   . Malignant hyperthermia Neg Hx   . Pseudochol deficiency Neg Hx     History  Substance Use Topics  . Smoking status: Never Smoker   . Smokeless tobacco: Never Used  . Alcohol Use: Yes     Comment: Social drink      Review of Systems  Constitutional: Negative for fever and chills.  HENT: Positive for congestion, sore throat and drooling. Negative for neck pain, neck stiffness, dental problem and voice change.   Eyes: Negative for pain.  Respiratory: Negative for shortness of breath.   Cardiovascular: Negative for chest pain.  Gastrointestinal: Negative for  abdominal pain.  Genitourinary: Negative for dysuria.  Musculoskeletal: Negative for back pain.  Skin: Negative for rash.  Neurological: Positive for headaches.  All other systems reviewed and are negative.    Allergies  Review of patient's allergies indicates no known allergies.  Home Medications   Current Outpatient Rx  Name  Route  Sig  Dispense  Refill  . ibuprofen (ADVIL,MOTRIN) 200 MG tablet   Oral   Take 200 mg by mouth every 6 (six) hours as needed for pain.         Marland Kitchen penicillin v potassium (VEETID) 500 MG tablet   Oral   Take 1 tablet (500 mg total) by mouth 3 (three) times daily.   30 tablet   0     There were no vitals taken for this visit.  Physical Exam  Constitutional: He is oriented to person, place, and time. He appears well-developed and well-nourished.  HENT:  Head: Normocephalic and atraumatic.  Nose: Nose normal.  Uvula midline, posterior pharynx erythema, no exudates or tonsillar swelling  Eyes: EOM are normal. Pupils are equal, round, and reactive to light.  Neck: Normal range of motion. Neck supple.  Cardiovascular: Normal rate, regular rhythm and intact distal pulses.   Pulmonary/Chest: Effort normal and breath sounds normal. No stridor. No respiratory distress. He has no wheezes.  Musculoskeletal: Normal range of motion. He exhibits no edema.  Lymphadenopathy:  He has no cervical adenopathy.  Neurological: He is alert and oriented to person, place, and time.  Skin: Skin is warm and dry.    ED Course  Procedures (including critical care time)  IM decadron  PO fluids challenge, no difficulty swallowing. Plan d/c home. ENT referral. Return precautions verbalized as understood.  MDM  Pharyngitis  Medications provided  VS and nursing notes and prior records reviewed        Sunnie Nielsen, MD 12/03/12 [phone]

## 2012-12-18 ENCOUNTER — Telehealth (HOSPITAL_COMMUNITY): Payer: Self-pay | Admitting: Emergency Medicine

## 2012-12-18 NOTE — ED Notes (Signed)
Sean  @ [facility]  [phone] called to verify note. Charge nurse Diane at [employer] talked to [facility] RN and she stated that she had written a note for Shailen Thielen for 5/14.

## 2013-04-04 IMAGING — CR DG WRIST COMPLETE 3+V*R*
4 series · 4 of 4 positions shown · non-contrast
Comparison: None.

CLINICAL DATA: Blunt trauma; right radial wrist pain.

RIGHT WRIST - COMPLETE 3+ VIEW

[x wrist pa right]
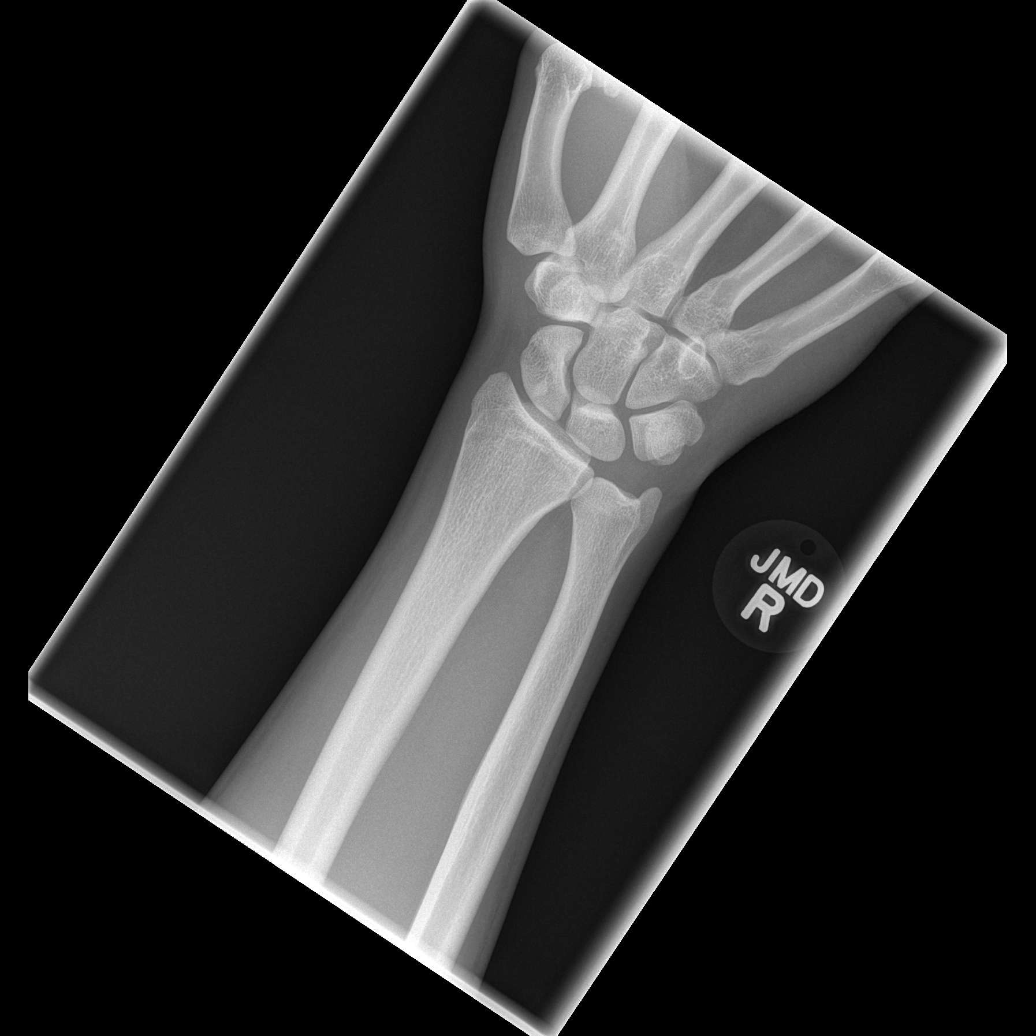

[x wrist obl right]
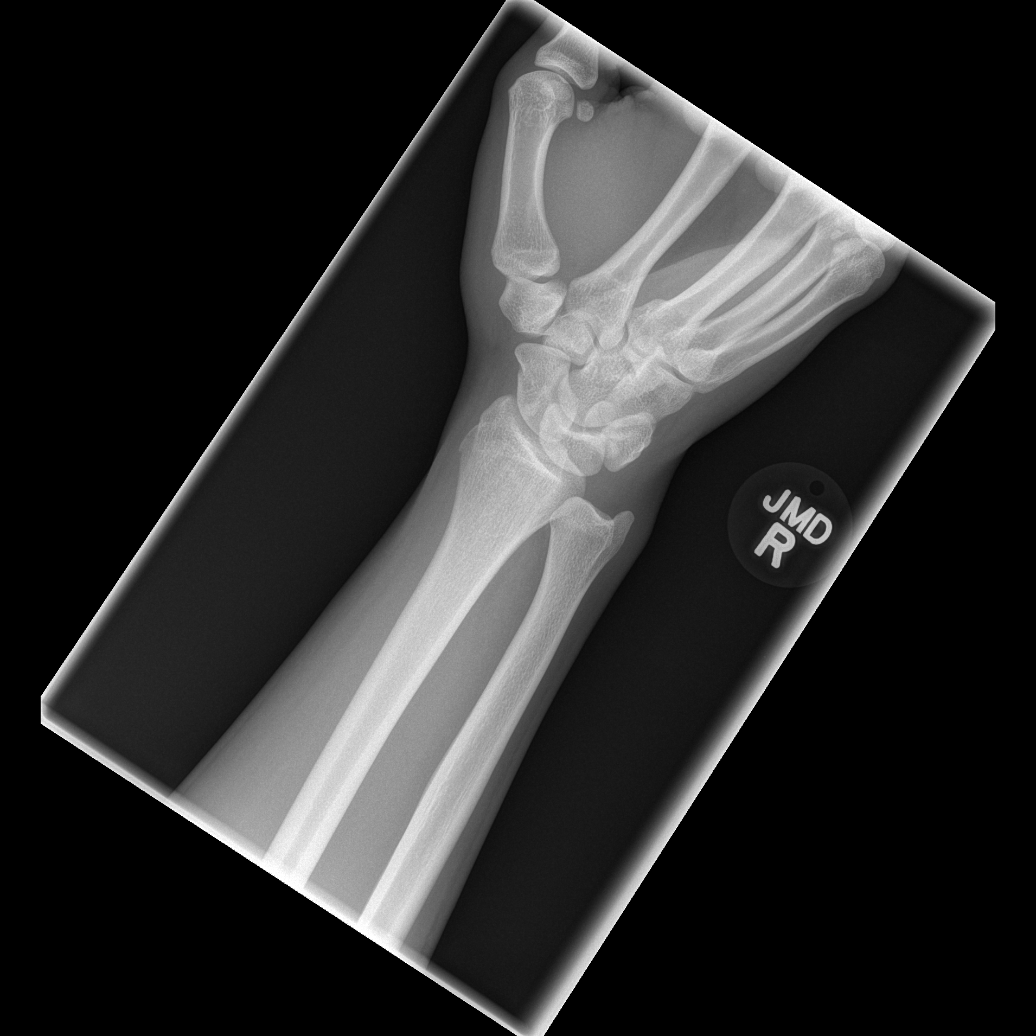

[x wrist lat right]
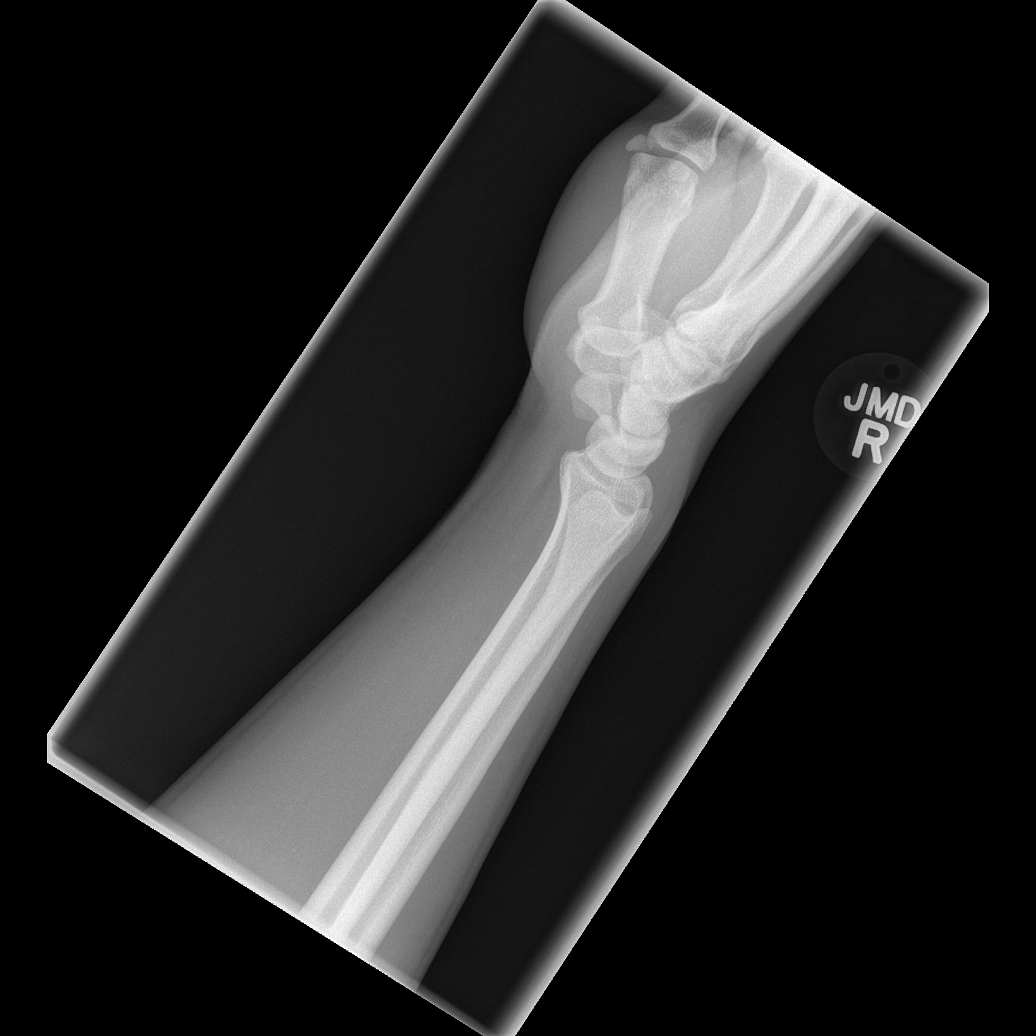

[x wrist navicular]
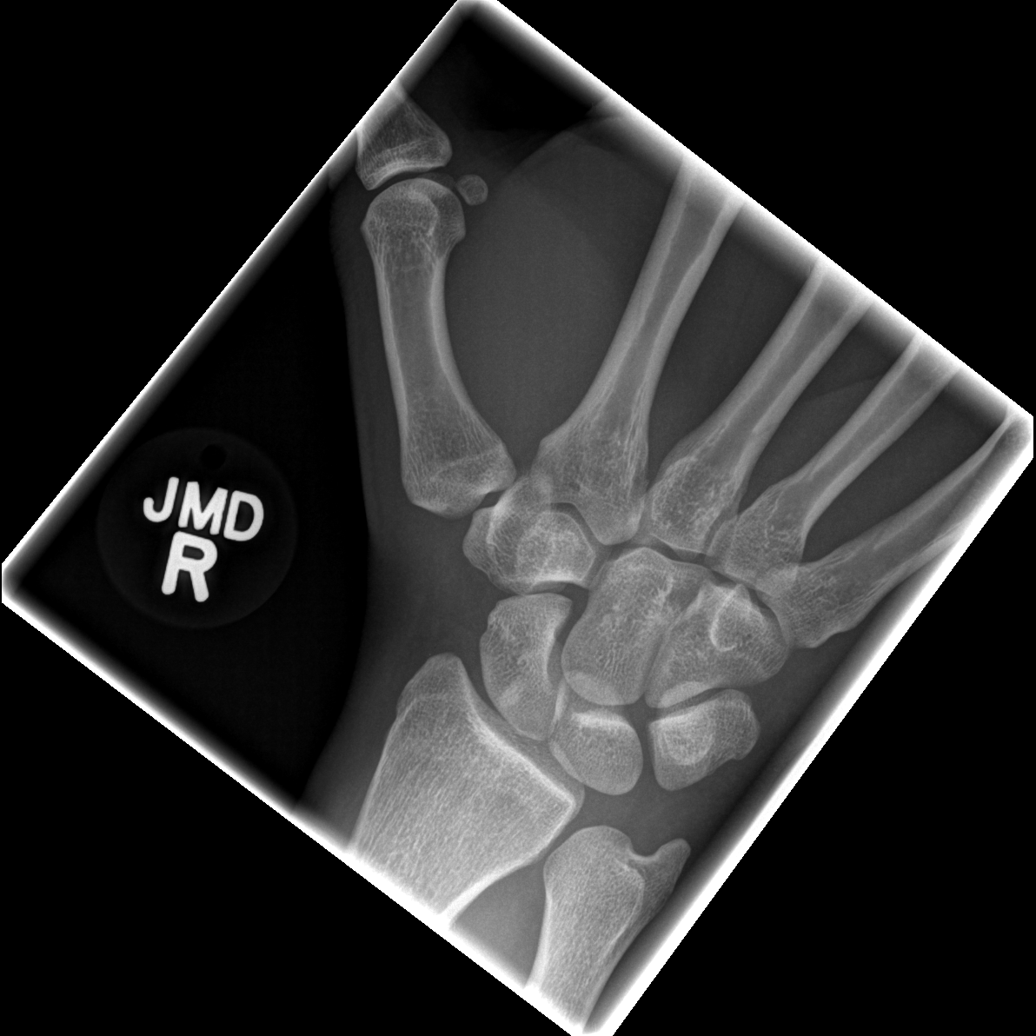

[4 of 4 positions shown; findings below may reference images not displayed]

FINDINGS: There is no evidence of fracture or dislocation.  The
carpal rows are intact, and demonstrate normal alignment.  The
joint spaces are preserved.  A bone island is seen within the
scaphoid.  Mild negative ulnar variance is noted.

No significant soft tissue abnormalities are seen.
IMPRESSION: No evidence of fracture or dislocation.

## 2013-07-27 ENCOUNTER — Encounter (HOSPITAL_COMMUNITY): Payer: Self-pay | Admitting: Emergency Medicine

## 2013-07-27 ENCOUNTER — Emergency Department (HOSPITAL_COMMUNITY)
Admission: EM | Admit: 2013-07-27 | Discharge: 2013-07-28 | Disposition: A | Discharge status: Home / Self Care | Payer: PRIVATE HEALTH INSURANCE | Attending: Emergency Medicine | Admitting: Emergency Medicine

## 2013-07-27 DIAGNOSIS — R112 Nausea with vomiting, unspecified: Secondary | ICD-10-CM | POA: Insufficient documentation

## 2013-07-27 DIAGNOSIS — J45909 Unspecified asthma, uncomplicated: Secondary | ICD-10-CM | POA: Insufficient documentation

## 2013-07-27 DIAGNOSIS — Z8719 Personal history of other diseases of the digestive system: Secondary | ICD-10-CM | POA: Insufficient documentation

## 2013-07-27 DIAGNOSIS — R059 Cough, unspecified: Secondary | ICD-10-CM

## 2013-07-27 DIAGNOSIS — R109 Unspecified abdominal pain: Secondary | ICD-10-CM

## 2013-07-27 DIAGNOSIS — Z9889 Other specified postprocedural states: Secondary | ICD-10-CM | POA: Insufficient documentation

## 2013-07-27 DIAGNOSIS — R1084 Generalized abdominal pain: Secondary | ICD-10-CM | POA: Insufficient documentation

## 2013-07-27 DIAGNOSIS — R6883 Chills (without fever): Secondary | ICD-10-CM | POA: Insufficient documentation

## 2013-07-27 DIAGNOSIS — R51 Headache: Secondary | ICD-10-CM | POA: Insufficient documentation

## 2013-07-27 DIAGNOSIS — IMO0001 Reserved for inherently not codable concepts without codable children: Secondary | ICD-10-CM | POA: Insufficient documentation

## 2013-07-27 DIAGNOSIS — R05 Cough: Secondary | ICD-10-CM

## 2013-07-27 DIAGNOSIS — Z8619 Personal history of other infectious and parasitic diseases: Secondary | ICD-10-CM | POA: Insufficient documentation

## 2013-07-27 NOTE — ED Notes (Signed)
Pt's girlfriend states pt has been having abd pain for the past 2 days and has been c/o migraine headache with vomiting  Pt states he has been coughing up some blood  Girlfriend states pt had wheezing in his sleep last night  Pt has hx of asthma

## 2013-07-28 LAB — COMPREHENSIVE METABOLIC PANEL
ALK PHOS: 37 U/L — AB (ref 39–117)
ALT: 15 U/L (ref 0–53)
AST: 16 U/L (ref 0–37)
Albumin: 3.5 g/dL (ref 3.5–5.2)
BILIRUBIN TOTAL: 0.2 mg/dL — AB (ref 0.3–1.2)
BUN: 16 mg/dL (ref 6–23)
CHLORIDE: 102 meq/L (ref 96–112)
CO2: 23 meq/L (ref 19–32)
CREATININE: 0.84 mg/dL (ref 0.50–1.35)
Calcium: 9.1 mg/dL (ref 8.4–10.5)
GFR calc Af Amer: >90 mL/min (ref >90)
Glucose, Bld: 104 mg/dL — ABNORMAL HIGH (ref 70–99)
Potassium: 3.8 mEq/L (ref 3.7–5.3)
Sodium: 139 mEq/L (ref 137–147)
Total Protein: 6.1 g/dL (ref 6.0–8.3)

## 2013-07-28 LAB — CBC WITH DIFFERENTIAL/PLATELET
Basophils Absolute: 0.1 10*3/uL (ref 0.0–0.1)
Basophils Relative: 1 % (ref 0–1)
Eosinophils Absolute: 0.3 10*3/uL (ref 0.0–0.7)
Eosinophils Relative: 5 % (ref 0–5)
HEMATOCRIT: 39.6 % (ref 39.0–52.0)
HEMOGLOBIN: 13.2 g/dL (ref 13.0–17.0)
LYMPHS PCT: 34 % (ref 12–46)
Lymphs Abs: 2.2 10*3/uL (ref 0.7–4.0)
MCH: 28 pg (ref 26.0–34.0)
MCHC: 33.3 g/dL (ref 30.0–36.0)
MCV: 84.1 fL (ref 78.0–100.0)
MONO ABS: 0.6 10*3/uL (ref 0.1–1.0)
MONOS PCT: 9 % (ref 3–12)
NEUTROS ABS: 3.4 10*3/uL (ref 1.7–7.7)
Neutrophils Relative %: 52 % (ref 43–77)
Platelets: 252 10*3/uL (ref 150–400)
RBC: 4.71 MIL/uL (ref 4.22–5.81)
RDW: 12.4 % (ref 11.5–15.5)
WBC: 6.5 10*3/uL (ref 4.0–10.5)

## 2013-07-28 LAB — LIPASE, BLOOD: Lipase: 24 U/L (ref 11–59)

## 2013-07-28 LAB — URINALYSIS, ROUTINE W REFLEX MICROSCOPIC
BILIRUBIN URINE: NEGATIVE
GLUCOSE, UA: NEGATIVE mg/dL
Hgb urine dipstick: NEGATIVE
KETONES UR: NEGATIVE mg/dL
LEUKOCYTES UA: NEGATIVE
Nitrite: NEGATIVE
PROTEIN: NEGATIVE mg/dL
Specific Gravity, Urine: 1.027 (ref 1.005–1.030)
Urobilinogen, UA: 0.2 mg/dL (ref 0.0–1.0)
pH: 6 (ref 5.0–8.0)

## 2013-07-28 MED ORDER — ONDANSETRON HCL 4 MG PO TABS: 4.0000 mg | ORAL_TABLET | Freq: Four times a day (QID) | ORAL | Status: DC

## 2013-07-28 MED ORDER — ONDANSETRON 8 MG PO TBDP
8.0000 mg | ORAL_TABLET | Freq: Once | ORAL | Status: AC
  Administered 2013-07-28: 8 mg via ORAL
  Filled 2013-07-28: qty 1

## 2013-07-28 MED ORDER — IBUPROFEN 800 MG PO TABS
800.0000 mg | ORAL_TABLET | Freq: Once | ORAL | Status: AC
  Administered 2013-07-28: 800 mg via ORAL
  Filled 2013-07-28: qty 1

## 2013-07-28 NOTE — Discharge Instructions (Signed)
Cough, Adult  A cough is a reflex. It helps you clear your throat and airways. A cough can help heal your body. A cough can last 2 or 3 weeks (acute) or may last more than 8 weeks (chronic). Some common causes of a cough can include an infection, allergy, or a cold. HOME CARE  Only take medicine as told by your doctor.  If given, take your medicines (antibiotics) as told. Finish them even if you start to feel better.  Use a cold steam vaporizer or humidier in your home. This can help loosen thick spit (secretions).  Sleep so you are almost sitting up (semi-upright). Use pillows to do this. This helps reduce coughing.  Rest as needed.  Stop smoking if you smoke. GET HELP RIGHT AWAY IF:  You have yellowish-white fluid (pus) in your thick spit.  Your cough gets worse.  Your medicine does not reduce coughing, and you are losing sleep.  You cough up blood.  You have trouble breathing.  Your pain gets worse and medicine does not help.  You have a fever. MAKE SURE YOU:   Understand these instructions.  Will watch your condition.  Will get help right away if you are not doing well or get worse. Document Released: 03/22/2011 Document Revised: 10/01/2011 Document Reviewed: 03/22/2011 [facility] Patient Information 2014 [place], [place]. Abdominal Pain Many things can cause belly (abdominal) pain. Most times, the belly pain is not dangerous. The amount of belly pain does not tell how serious the problem may be. Many cases of belly pain can be watched and treated at home. HOME CARE   Do not take medicines that help you go poop (laxatives) unless told to by your doctor.  Only take medicine as told by your doctor.  Eat or drink as told by your doctor. Your doctor will tell you if you should be on a special diet. GET HELP RIGHT AWAY IF:   The pain does not go away.  You have a fever.  You keep throwing up (vomiting).  The pain changes and is only in the right or left part of the  belly.  You have bloody or tarry looking poop. MAKE SURE YOU:   Understand these instructions.  Will watch your condition.  Will get help right away if you are not doing well or get worse. Document Released: 12/26/2007 Document Revised: 10/01/2011 Document Reviewed: 07/25/2009 [facility] Patient Information 2014 [place], [place].

## 2013-07-28 NOTE — ED Provider Notes (Signed)
CSN: [id number]     Arrival date & time 07/27/13  2257 History   First MD Initiated Contact with Patient 07/28/13 0110     Chief Complaint  Patient presents with  . Abdominal Pain   (Consider location/radiation/quality/duration/timing/severity/associated sxs/prior Treatment) Patient is a 23 y.o. male presenting with abdominal pain. The history is provided by the patient. No language interpreter was used.  Abdominal Pain Pain location:  Generalized Pain quality: aching   Pain radiates to:  Does not radiate Pain severity:  Mild Onset quality:  Gradual Duration:  2 days Timing:  Intermittent Progression:  Waxing and waning Chronicity:  New Context: recent illness (viral illness)   Context: not suspicious food intake and not trauma   Relieved by:  None tried Worsened by:  Nothing tried Ineffective treatments:  None tried Associated symptoms: chills, cough, nausea and vomiting (NB/NB)   Associated symptoms: no chest pain, no constipation, no diarrhea, no fever, no hematemesis, no hematochezia, no hematuria and no shortness of breath   Cough:    Cough characteristics:  Productive   Sputum characteristics:  Clear and yellow (recently streaked with small amount of bright red blood x 1; no clots)   Severity:  Moderate   Onset quality:  Gradual   Duration:  2 days   Timing:  Intermittent   Progression:  Worsening   Chronicity:  New Risk factors: no alcohol abuse, no aspirin use, not elderly, no NSAID use, not obese and no recent hospitalization     Past Medical History  Diagnosis Date  . Depression 07/07/2011  . Suicide attempt 07/07/2011  . ADHD (attention deficit hyperactivity disorder) 07/07/2011  . Chlamydia infection 07/07/2011  . History of chickenpox   . Positive TB test   . Hernia   . Asthma     takes albuterol  . Asthma 07/07/2011  . Bronchitis 2008   Past Surgical History  Procedure Laterality Date  . Wisdom tooth extraction  2008  . Ventral hernia repair   09/20/2011    Procedure: HERNIA REPAIR VENTRAL ADULT;  Surgeon: Rulon AbideBrian David Layton, DO;  [facility];  Service: General;  Laterality: N/A;   with possible mesh   Family History  Problem Relation Age of Onset  . Arthritis Other     Grandparent  . Hypertension Other     Parent, Grandparent  . Anesthesia problems Father   . Hypotension Neg Hx   . Malignant hyperthermia Neg Hx   . Pseudochol deficiency Neg Hx    History  Substance Use Topics  . Smoking status: Never Smoker   . Smokeless tobacco: Never Used  . Alcohol Use: Yes     Comment: Social drink    Review of Systems  Constitutional: Positive for chills. Negative for fever.  HENT: Negative for trouble swallowing.   Eyes: Negative for visual disturbance.  Respiratory: Positive for cough. Negative for shortness of breath.   Cardiovascular: Negative for chest pain.  Gastrointestinal: Positive for nausea, vomiting (NB/NB) and abdominal pain. Negative for diarrhea, constipation, hematochezia and hematemesis.  Genitourinary: Negative for hematuria.  Musculoskeletal: Positive for myalgias.  Neurological: Positive for headaches. Negative for speech difficulty, weakness and numbness.  All other systems reviewed and are negative.    Allergies  Review of patient's allergies indicates no known allergies.  Home Medications   Current Outpatient Rx  Name  Route  Sig  Dispense  Refill  . diphenhydramine-acetaminophen (TYLENOL PM) 25-500 MG TABS   Oral   Take 1 tablet by mouth at  bedtime as needed (pain).         Marland Kitchen ibuprofen (ADVIL,MOTRIN) 200 MG tablet   Oral   Take 200 mg by mouth every 6 (six) hours as needed for pain.         Marland Kitchen ondansetron (ZOFRAN) 4 MG tablet   Oral   Take 1 tablet (4 mg total) by mouth every 6 (six) hours.   12 tablet   0    BP 137/59  Pulse 60  Temp(Src) 98.2 F (36.8 C) (Oral)  Resp 16  Ht 6\' 4"  (1.93 m)  SpO2 97%  Physical Exam  Nursing note and vitals reviewed. Constitutional: He  is oriented to person, place, and time. He appears well-developed and well-nourished. No distress.  HENT:  Head: Normocephalic and atraumatic.  Mouth/Throat: Oropharynx is clear and moist. No oropharyngeal exudate.  Uvula midline and airway patent. Patient tolerating secretions without difficulty  Eyes: Conjunctivae and EOM are normal. Pupils are equal, round, and reactive to light. No scleral icterus.  Neck: Normal range of motion. Neck supple.  Cardiovascular: Normal rate, regular rhythm and normal heart sounds.   Pulmonary/Chest: Effort normal and breath sounds normal. No stridor. No respiratory distress. He has no wheezes. He has no rales.  Abdominal: Soft. He exhibits no distension and no mass. There is tenderness (generalized, mild, and with deep palpation only). There is no rebound and no guarding.  No focal tenderness or peritoneal signs.  Musculoskeletal: Normal range of motion.  Neurological: He is alert and oriented to person, place, and time.  GCS 15. Patient speaks in full goal oriented sentences. He moves his extremities without ataxia.  Skin: Skin is warm and dry. No rash noted. He is not diaphoretic. No erythema. No pallor.  Psychiatric: He has a normal mood and affect. His behavior is normal.    ED Course  Procedures (including critical care time) Labs Review Labs Reviewed  COMPREHENSIVE METABOLIC PANEL - Abnormal; Notable for the following:    Glucose, Bld 104 (*)    Alkaline Phosphatase 37 (*)    Total Bilirubin 0.2 (*)    All other components within normal limits  CBC WITH DIFFERENTIAL  LIPASE, BLOOD  URINALYSIS, ROUTINE W REFLEX MICROSCOPIC   Imaging Review No results found.  EKG Interpretation   None       MDM   1. Abdominal pain   2. Cough    Patient presents for abdominal aching, diffuse, in the presence of nausea, NB/NB emesis x 2, cough, myalgias, and sore throat. Patient is well and nontoxic appearing, hemodynamically stable, and afebrile.  Physical exam significant for mild generalized abdominal tenderness with deep palpation only. No peritoneal signs or evidence of acute surgical abdomen. Patient treated in the emergency department with ibuprofen and Zofran for symptoms with mild improvement. Abdominal reexamination stable throughout ED course. Patient today without leukocytosis or electrolyte imbalance. Liver and kidney function preserved. Lipase WNL and urine does not suggest infection.   Doubt acute intra-abdominal process. Some likely secondary to viral illness causing associated cough, myalgias , and sore throat. Patient does endorse one episode of cough productive of clear/yellow sputum streaked with bright red blood. He denies clots as well as persistent hemoptysis or hematemesis. Suspect this to be secondary to bronchitis and persistent coughing. Patient denies shortness of breath and he is without signs of acute blood loss; no tachycardia or anemia. Doubt pulmonary embolism given lack of tachycardia, tachypnea, dyspnea, or hypoxia.  Patient stable for discharge with primary care followup. Zofran  prescribed for persistent nausea. Return precautions discussed and the patient agreeable to plan with no unaddressed concerns.    Antony Madura, PA-C 08/03/13 1857

## 2013-07-28 NOTE — ED Notes (Signed)
Pt has had a headache for 2 days; vomiting as well

## 2013-07-28 NOTE — ED Notes (Signed)
Let pt know we needed urine specimen, left labeled cup in room, pt stated he will be able to go later.

## 2013-07-30 NOTE — ED Provider Notes (Signed)
Medical screening examination/treatment/procedure(s) were performed by non-physician practitioner and as supervising physician I was immediately available for consultation/collaboration.  EKG Interpretation   None        Aime Carreras K Torii Royse-Rasch, MD 07/30/13 [phone]

## 2013-08-04 NOTE — ED Provider Notes (Signed)
Medical screening examination/treatment/procedure(s) were performed by non-physician practitioner and as supervising physician I was immediately available for consultation/collaboration.  EKG Interpretation   None        Maudy Yonan K Sandi Towe-Rasch, MD 08/04/13 [phone]

## 2013-10-24 ENCOUNTER — Emergency Department (HOSPITAL_COMMUNITY): Payer: PRIVATE HEALTH INSURANCE

## 2013-10-24 ENCOUNTER — Encounter (HOSPITAL_COMMUNITY): Payer: Self-pay | Admitting: Emergency Medicine

## 2013-10-24 ENCOUNTER — Emergency Department (HOSPITAL_COMMUNITY)
Admission: EM | Admit: 2013-10-24 | Discharge: 2013-10-24 | Disposition: A | Discharge status: Home / Self Care | Payer: PRIVATE HEALTH INSURANCE | Attending: Emergency Medicine | Admitting: Emergency Medicine

## 2013-10-24 DIAGNOSIS — Z8719 Personal history of other diseases of the digestive system: Secondary | ICD-10-CM | POA: Insufficient documentation

## 2013-10-24 DIAGNOSIS — Z8611 Personal history of tuberculosis: Secondary | ICD-10-CM | POA: Insufficient documentation

## 2013-10-24 DIAGNOSIS — J45909 Unspecified asthma, uncomplicated: Secondary | ICD-10-CM | POA: Insufficient documentation

## 2013-10-24 DIAGNOSIS — R5381 Other malaise: Secondary | ICD-10-CM | POA: Insufficient documentation

## 2013-10-24 DIAGNOSIS — Z8659 Personal history of other mental and behavioral disorders: Secondary | ICD-10-CM | POA: Insufficient documentation

## 2013-10-24 DIAGNOSIS — Z8619 Personal history of other infectious and parasitic diseases: Secondary | ICD-10-CM | POA: Insufficient documentation

## 2013-10-24 DIAGNOSIS — R079 Chest pain, unspecified: Secondary | ICD-10-CM | POA: Insufficient documentation

## 2013-10-24 DIAGNOSIS — R5383 Other fatigue: Secondary | ICD-10-CM

## 2013-10-24 DIAGNOSIS — Z79899 Other long term (current) drug therapy: Secondary | ICD-10-CM | POA: Insufficient documentation

## 2013-10-24 LAB — BASIC METABOLIC PANEL
BUN: 11 mg/dL (ref 6–23)
CALCIUM: 10.2 mg/dL (ref 8.4–10.5)
CO2: 26 meq/L (ref 19–32)
CREATININE: 1 mg/dL (ref 0.50–1.35)
Chloride: 100 mEq/L (ref 96–112)
GFR calc Af Amer: >90 mL/min (ref >90)
GFR calc non Af Amer: >90 mL/min (ref >90)
GLUCOSE: 94 mg/dL (ref 70–99)
Potassium: 4.2 mEq/L (ref 3.7–5.3)
Sodium: 137 mEq/L (ref 137–147)

## 2013-10-24 LAB — I-STAT TROPONIN, ED: TROPONIN I, POC: 0 ng/mL (ref 0.00–0.08)

## 2013-10-24 LAB — CBC
HEMATOCRIT: 43.8 % (ref 39.0–52.0)
HEMOGLOBIN: 15 g/dL (ref 13.0–17.0)
MCH: 28.6 pg (ref 26.0–34.0)
MCHC: 34.2 g/dL (ref 30.0–36.0)
MCV: 83.4 fL (ref 78.0–100.0)
Platelets: 298 10*3/uL (ref 150–400)
RBC: 5.25 MIL/uL (ref 4.22–5.81)
RDW: 12.3 % (ref 11.5–15.5)
WBC: 4.6 10*3/uL (ref 4.0–10.5)

## 2013-10-24 NOTE — ED Provider Notes (Signed)
CSN: [id number]     Arrival date & time 10/24/13  1429 History  This chart was scribed for non-physician practitioner, Teressa Lower, FNP,working with Hurman Horn, MD, by Karle Plumber, ED Scribe.  This patient was seen in room WTR7/WTR7 and the patient's care was started at 3:02 PM.  Chief Complaint  Patient presents with  . Chest Pain   The history is provided by the patient. No language interpreter was used.   HPI Comments:  Earl Compton is a 23 y.o. male who presents to the Emergency Department complaining of mild chest pain that started approximately three hours ago. Pt states he was working and was experiencing some mild chest pain but states he has been working 12 hour days for the past six days in a row and drinking energy drinks. He presents because his employer requested him to be checked out and get a note stating he was okay to continue to work. Pt is dismissive of the issue and states it is from fatigue from working. He denies any current pain.    Past Medical History  Diagnosis Date  . Depression 07/07/2011  . Suicide attempt 07/07/2011  . ADHD (attention deficit hyperactivity disorder) 07/07/2011  . Chlamydia infection 07/07/2011  . History of chickenpox   . Positive TB test   . Hernia   . Asthma     takes albuterol  . Asthma 07/07/2011  . Bronchitis 2008   Past Surgical History  Procedure Laterality Date  . Wisdom tooth extraction  2008  . Ventral hernia repair  09/20/2011    Procedure: HERNIA REPAIR VENTRAL ADULT;  Surgeon: Rulon Abide, DO;  [facility];  Service: General;  Laterality: N/A;   with possible mesh   Family History  Problem Relation Age of Onset  . Arthritis Other     Grandparent  . Hypertension Other     Parent, Grandparent  . Anesthesia problems Father   . Hypotension Neg Hx   . Malignant hyperthermia Neg Hx   . Pseudochol deficiency Neg Hx    History  Substance Use Topics  . Smoking status: Never Smoker   . Smokeless  tobacco: Never Used  . Alcohol Use: Yes     Comment: Social drink    Review of Systems  Cardiovascular: Positive for chest pain.  All other systems reviewed and are negative.    Allergies  Review of patient's allergies indicates no known allergies.  Home Medications   Current Outpatient Rx  Name  Route  Sig  Dispense  Refill  . diphenhydramine-acetaminophen (TYLENOL PM) 25-500 MG TABS   Oral   Take 1 tablet by mouth at bedtime as needed (pain).         Marland Kitchen ibuprofen (ADVIL,MOTRIN) 200 MG tablet   Oral   Take 200 mg by mouth every 6 (six) hours as needed for pain.         Marland Kitchen ondansetron (ZOFRAN) 4 MG tablet   Oral   Take 1 tablet (4 mg total) by mouth every 6 (six) hours.   12 tablet   0    Triage Vitals: BP 141/85  Pulse 86  Temp(Src) 98.3 F (36.8 C) (Oral)  Resp 16  SpO2 100% Physical Exam  Nursing note and vitals reviewed. Constitutional: He is oriented to person, place, and time. He appears well-developed and well-nourished.  HENT:  Head: Normocephalic and atraumatic.  Eyes: EOM are normal.  Neck: Normal range of motion.  Cardiovascular: Normal rate, regular rhythm and  normal heart sounds.  Exam reveals no gallop and no friction rub.   No murmur heard. Pulmonary/Chest: Effort normal and breath sounds normal. No respiratory distress. He has no wheezes. He has no rales. He exhibits no tenderness.  Musculoskeletal: Normal range of motion.  Neurological: He is alert and oriented to person, place, and time.  Skin: Skin is warm and dry.  Psychiatric: He has a normal mood and affect. His behavior is normal.    ED Course  Procedures (including critical care time) DIAGNOSTIC STUDIES: Oxygen Saturation is 100% on RA, normal by my interpretation.   COORDINATION OF CARE: 3:04 PM- Will order CXR and EKG. Pt verbalizes understanding and agrees to plan.  Medications - No data to display  Labs Review Labs Reviewed  BASIC METABOLIC PANEL  CBC  I-STAT  TROPOININ, ED   Imaging Review Dg Chest 2 View  10/24/2013   CLINICAL DATA:  Chest pain  EXAM: CHEST  2 VIEW  COMPARISON:  October 18, 2011  FINDINGS: Lungs are clear. Heart size and pulmonary vascularity are normal. No adenopathy. No pneumothorax. No bone lesions.  IMPRESSION: No edema or consolidation.   Electronically Signed   By: Bretta BangWilliam  Woodruff M.D.   On: 10/24/2013 15:00     EKG Interpretation   Date/Time:  Saturday October 24 2013 14:45:20 EDT Ventricular Rate:  86 PR Interval:  137 QRS Duration: 96 QT Interval:  342 QTC Calculation: 409 R Axis:   74 Text Interpretation:  Sinus rhythm Borderline T wave abnormalities  Borderline ST elevation, anterior leads ST elevation, consider early  repolarization No significant change since last tracing Confirmed by  [facility] IncBEDNAR  MD, Jonny RuizJOHN ([id number]) on 10/24/2013 2:51:56 PM      MDM   Final diagnoses:  Chest pain    Doubt acs in nautre. No infection noted in lungs. Doubt pe. Pt okay to go home  I personally performed the services described in this documentation, which was scribed in my presence. The recorded information has been reviewed and is accurate.    Teressa LowerVrinda Cedrica Brune, NP 10/24/13 [phone]

## 2013-10-24 NOTE — ED Provider Notes (Signed)
Medical screening examination/treatment/procedure(s) were performed by non-physician practitioner and as supervising physician I was immediately available for consultation/collaboration.   EKG Interpretation   Date/Time:  Saturday October 24 2013 14:45:20 EDT Ventricular Rate:  86 PR Interval:  137 QRS Duration: 96 QT Interval:  342 QTC Calculation: 409 R Axis:   74 Text Interpretation:  Sinus rhythm Borderline T wave abnormalities  Borderline ST elevation, anterior leads ST elevation, consider early  repolarization No significant change since last tracing Confirmed by  [facility]  MD, Jonny RuizJOHN ([id number]) on 10/24/2013 2:51:56 PM       Earl HornJohn M Kofi Murrell, MD 10/24/13 2122

## 2013-10-24 NOTE — Discharge Instructions (Signed)
Chest Pain (Nonspecific) °It is often hard to give a specific diagnosis for the cause of chest pain. There is always a chance that your pain could be related to something serious, such as a heart attack or a blood clot in the lungs. You need to follow up with your caregiver for further evaluation. °CAUSES  °· Heartburn. °· Pneumonia or bronchitis. °· Anxiety or stress. °· Inflammation around your heart (pericarditis) or lung (pleuritis or pleurisy). °· A blood clot in the lung. °· A collapsed lung (pneumothorax). It can develop suddenly on its own (spontaneous pneumothorax) or from injury (trauma) to the chest. °· Shingles infection (herpes zoster virus). °The chest wall is composed of bones, muscles, and cartilage. Any of these can be the source of the pain. °· The bones can be bruised by injury. °· The muscles or cartilage can be strained by coughing or overwork. °· The cartilage can be affected by inflammation and become sore (costochondritis). °DIAGNOSIS  °Lab tests or other studies, such as X-rays, electrocardiography, stress testing, or cardiac imaging, may be needed to find the cause of your pain.  °TREATMENT  °· Treatment depends on what may be causing your chest pain. Treatment may include: °· Acid blockers for heartburn. °· Anti-inflammatory medicine. °· Pain medicine for inflammatory conditions. °· Antibiotics if an infection is present. °· You may be advised to change lifestyle habits. This includes stopping smoking and avoiding alcohol, caffeine, and chocolate. °· You may be advised to keep your head raised (elevated) when sleeping. This reduces the chance of acid going backward from your stomach into your esophagus. °· Most of the time, nonspecific chest pain will improve within 2 to 3 days with rest and mild pain medicine. °HOME CARE INSTRUCTIONS  °· If antibiotics were prescribed, take your antibiotics as directed. Finish them even if you start to feel better. °· For the next few days, avoid physical  activities that bring on chest pain. Continue physical activities as directed. °· Do not smoke. °· Avoid drinking alcohol. °· Only take over-the-counter or prescription medicine for pain, discomfort, or fever as directed by your caregiver. °· Follow your caregiver's suggestions for further testing if your chest pain does not go away. °· Keep any follow-up appointments you made. If you do not go to an appointment, you could develop lasting (chronic) problems with pain. If there is any problem keeping an appointment, you must call to reschedule. °SEEK MEDICAL CARE IF:  °· You think you are having problems from the medicine you are taking. Read your medicine instructions carefully. °· Your chest pain does not go away, even after treatment. °· You develop a rash with blisters on your chest. °SEEK IMMEDIATE MEDICAL CARE IF:  °· You have increased chest pain or pain that spreads to your arm, neck, jaw, back, or abdomen. °· You develop shortness of breath, an increasing cough, or you are coughing up blood. °· You have severe back or abdominal pain, feel nauseous, or vomit. °· You develop severe weakness, fainting, or chills. °· You have a fever. °THIS IS AN EMERGENCY. Do not wait to see if the pain will go away. Get medical help at once. Call your local emergency services (911 in [place] Do not drive yourself to the hospital. °MAKE SURE YOU:  °· Understand these instructions. °· Will watch your condition. °· Will get help right away if you are not doing well or get worse. °Document Released: 04/18/2005 Document Revised: 10/01/2011 Document Reviewed: 02/12/2008 °ExitCare® Patient Information ©2014 [employer]   LL[employer]. ° °

## 2013-10-24 NOTE — ED Notes (Signed)
Pt states that he works 12 hr shifts.  He has worked 6 12 hr shifts in a row.  States that he started using energy drinks to get through because he hasn't been sleeping.  This is his 6th day of work.  C/o chest pain that started around noon.  Pt's boss insisted that he come to ER to get a doctor's note.  Pt states "I know exactly what happened.  I have been drinking 2 of those "rip its" that they sell at the convenience store and taking energy pills".  Pt's pain has subsided.

## 2013-11-12 ENCOUNTER — Emergency Department (HOSPITAL_COMMUNITY)
Admission: EM | Admit: 2013-11-12 | Discharge: 2013-11-13 | Disposition: A | Discharge status: Home / Self Care | Payer: PRIVATE HEALTH INSURANCE | Attending: Emergency Medicine | Admitting: Emergency Medicine

## 2013-11-12 ENCOUNTER — Encounter (HOSPITAL_COMMUNITY): Payer: Self-pay | Admitting: Emergency Medicine

## 2013-11-12 ENCOUNTER — Emergency Department (HOSPITAL_COMMUNITY): Payer: PRIVATE HEALTH INSURANCE

## 2013-11-12 DIAGNOSIS — W219XXA Striking against or struck by unspecified sports equipment, initial encounter: Secondary | ICD-10-CM | POA: Insufficient documentation

## 2013-11-12 DIAGNOSIS — Z8659 Personal history of other mental and behavioral disorders: Secondary | ICD-10-CM | POA: Insufficient documentation

## 2013-11-12 DIAGNOSIS — Z8719 Personal history of other diseases of the digestive system: Secondary | ICD-10-CM | POA: Insufficient documentation

## 2013-11-12 DIAGNOSIS — S8391XA Sprain of unspecified site of right knee, initial encounter: Secondary | ICD-10-CM

## 2013-11-12 DIAGNOSIS — Y9367 Activity, basketball: Secondary | ICD-10-CM | POA: Insufficient documentation

## 2013-11-12 DIAGNOSIS — S93409A Sprain of unspecified ligament of unspecified ankle, initial encounter: Secondary | ICD-10-CM | POA: Insufficient documentation

## 2013-11-12 DIAGNOSIS — Y9239 Other specified sports and athletic area as the place of occurrence of the external cause: Secondary | ICD-10-CM | POA: Insufficient documentation

## 2013-11-12 DIAGNOSIS — Y92838 Other recreation area as the place of occurrence of the external cause: Secondary | ICD-10-CM

## 2013-11-12 DIAGNOSIS — Z8619 Personal history of other infectious and parasitic diseases: Secondary | ICD-10-CM | POA: Insufficient documentation

## 2013-11-12 DIAGNOSIS — J45909 Unspecified asthma, uncomplicated: Secondary | ICD-10-CM | POA: Insufficient documentation

## 2013-11-12 DIAGNOSIS — S93401A Sprain of unspecified ligament of right ankle, initial encounter: Secondary | ICD-10-CM

## 2013-11-12 DIAGNOSIS — IMO0002 Reserved for concepts with insufficient information to code with codable children: Secondary | ICD-10-CM | POA: Insufficient documentation

## 2013-11-12 MED ORDER — HYDROCODONE-ACETAMINOPHEN 5-325 MG PO TABS: 1.0000 | ORAL_TABLET | ORAL | Status: DC | PRN

## 2013-11-12 MED ORDER — IBUPROFEN 800 MG PO TABS: 800.0000 mg | ORAL_TABLET | Freq: Three times a day (TID) | ORAL | Status: DC

## 2013-11-12 NOTE — Discharge Instructions (Signed)
Take vicodin as prescribed for severe pain.   Do not drive within four hours of taking this medication (may cause drowsiness or confusion).  Take up to 800mg of ibuprofen three times a day for the next 3-4 days (take with food).  Ice 3 times a day for 15-20 minutes.  Elevate when possible to decrease swelling and pain.  Activity as tolerated.  Follow up with the orthopedist if your pain has not started to improve in 5-7 days, or you develop weakness of the injured joint.    You may return to the ER if your pain worsens or you have any other concerns.   °

## 2013-11-12 NOTE — ED Notes (Signed)
Pt was playing basketball today and hurt his rt knee and rt ankle, has swelling noted to anterior of rt knee and anterior of ankle able to move digits painful on palpation, strong dorsal and pedal pulses.

## 2013-11-12 NOTE — ED Provider Notes (Signed)
CSN: [id number]     Arrival date & time 11/12/13  2213 History  This chart was scribed for non-physician practitioner Gwendalyn EgeKaty Bartlomiej Jenkinson, PA-C working with Audree CamelScott T Goldston, MD by Joaquin MusicKristina Sanchez-Matthews, ED Scribe. This patient was seen in room WTR7/WTR7 and the patient's care was started at 11:47 PM .   Chief Complaint  Patient presents with  . Ankle Pain  . Knee Pain   The history is provided by the patient. No language interpreter was used.   HPI Comments: Gerald LeitzMatthew A Regino is a 23 y.o. male who presents to the Emergency Department complaining of ongoing R ankle and R knee pain that began today. He states while playing basketball today, he reports dunking a basketball and was hit to the outside of his R leg by an opponent. Pt has a hx of mild sprains to R knee in the past. He states he has numbness and sensitivity to touch. Reports not being able to bear weight to his R leg secondary to pain in knee and ankle.  Has not taken anything for pain. Pt has an orthopedic provider.  Past Medical History  Diagnosis Date  . Depression 07/07/2011  . Suicide attempt 07/07/2011  . ADHD (attention deficit hyperactivity disorder) 07/07/2011  . Chlamydia infection 07/07/2011  . History of chickenpox   . Positive TB test   . Hernia   . Asthma     takes albuterol  . Asthma 07/07/2011  . Bronchitis 2008   Past Surgical History  Procedure Laterality Date  . Wisdom tooth extraction  2008  . Ventral hernia repair  09/20/2011    Procedure: HERNIA REPAIR VENTRAL ADULT;  Surgeon: Rulon AbideBrian David Layton, DO;  [facility];  Service: General;  Laterality: N/A;   with possible mesh   Family History  Problem Relation Age of Onset  . Arthritis Other     Grandparent  . Hypertension Other     Parent, Grandparent  . Anesthesia problems Father   . Hypotension Neg Hx   . Malignant hyperthermia Neg Hx   . Pseudochol deficiency Neg Hx    History  Substance Use Topics  . Smoking status: Never Smoker   .  Smokeless tobacco: Never Used  . Alcohol Use: Yes     Comment: Social drink    Review of Systems  All other systems reviewed and are negative.  Allergies  Review of patient's allergies indicates no known allergies.  Home Medications   Prior to Admission medications   Medication Sig Start Date End Date Taking? Authorizing Provider  ibuprofen (ADVIL,MOTRIN) 200 MG tablet Take 800 mg by mouth every 6 (six) hours as needed for moderate pain.   Yes Historical Provider, MD   BP 136/78  Pulse 88  Temp(Src) 98 F (36.7 C)  Resp 18  SpO2 100%  Physical Exam  Nursing note and vitals reviewed. Constitutional: He is oriented to person, place, and time. He appears well-developed and well-nourished. No distress.  HENT:  Head: Normocephalic and atraumatic.  Eyes:  Normal appearance  Neck: Normal range of motion.  Pulmonary/Chest: Effort normal.  Musculoskeletal: Normal range of motion.  RLE w/out deformities.  No edema, ecchymosis or abrasions.  Diffuse tenderness of knee and ankle.  Mild pain w/ passive ROM of ankle.  Severe pain w/ knee flexion and valgus stress.  No obvious laxity w/ valgus stress.  2+ DP pulse and distal sensation intact.    Neurological: He is alert and oriented to person, place, and time.  Psychiatric: He  has a normal mood and affect. His behavior is normal.    ED Course  Procedures  DIAGNOSTIC STUDIES: Oxygen Saturation is 100% on RA, normal by my interpretation.    COORDINATION OF CARE: 11:52 PM-Discussed treatment plan which includes will discharge pt with crutches, knee immobilizer, ankle boot and Vicodin. Informed pt to take Ibuprofen PRN, ice, elevate, and rest his R leg. Advised pt to F/U with Orthopedic provider. Pt agreed to plan.   Labs Review Labs Reviewed - No data to display  Imaging Review Dg Ankle Complete Right  11/12/2013   CLINICAL DATA:  Inversion injury to the right foot. Right lateral ankle pain.  EXAM: RIGHT ANKLE - COMPLETE 3+ VIEW   COMPARISON:  None.  FINDINGS: There is no evidence of fracture, dislocation, or joint effusion. There is no evidence of arthropathy or other focal bone abnormality. Soft tissues are unremarkable.  IMPRESSION: Negative.   Electronically Signed   By: Burman NievesWilliam  Stevens M.D.   On: 11/12/2013 23:31   Dg Knee Complete 4 Views Right  11/12/2013   CLINICAL DATA:  Injury  EXAM: RIGHT KNEE - COMPLETE 4+ VIEW  COMPARISON:  None.  FINDINGS: Large joint effusion.  No acute fracture.  No dislocation.  IMPRESSION: No acute bony injury.  Joint effusion.   Electronically Signed   By: Maryclare BeanArt  Hoss M.D.   On: 11/12/2013 23:27     EKG Interpretation None     MDM   Final diagnoses:  Sprain of right knee  Sprain of right ankle    22yo M presents w/ R knee/ankle injury, immediately following blunt valgus impact to R knee during basketball game this evening.  No visible trauma nor focal neuro deficits on exam.  Severe pain w/ valgus stress on knee.  Xrays sig for R knee effusion.  I have concern for MCL injury.  Ortho tech provided w/ knee immobilizer, ASO and crutches.  I d/c'd home w/ vicodin and recommended NSAID and RICE.  He has an orthopedist to f/u with.  Return precautions discussed. 6:32 AM   I personally performed the services described in this documentation, which was scribed in my presence. The recorded information has been reviewed and is accurate.    Otilio Miuatherine E Terrisha Lopata, PA-C 11/13/13 [phone]

## 2013-11-12 NOTE — ED Notes (Signed)
Ice pack applied to knee

## 2013-11-13 NOTE — ED Notes (Signed)
Ortho tech paged  

## 2013-11-16 NOTE — ED Provider Notes (Signed)
Medical screening examination/treatment/procedure(s) were performed by non-physician practitioner and as supervising physician I was immediately available for consultation/collaboration.   EKG Interpretation None        Audree CamelScott T Crockett Rallo, MD 11/16/13 [phone]

## 2013-12-09 ENCOUNTER — Emergency Department (HOSPITAL_COMMUNITY)
Admission: EM | Admit: 2013-12-09 | Discharge: 2013-12-09 | Disposition: A | Discharge status: Home / Self Care | Payer: PRIVATE HEALTH INSURANCE | Attending: Emergency Medicine | Admitting: Emergency Medicine

## 2013-12-09 ENCOUNTER — Encounter (HOSPITAL_COMMUNITY): Payer: Self-pay | Admitting: Emergency Medicine

## 2013-12-09 DIAGNOSIS — Z8719 Personal history of other diseases of the digestive system: Secondary | ICD-10-CM | POA: Insufficient documentation

## 2013-12-09 DIAGNOSIS — Z8659 Personal history of other mental and behavioral disorders: Secondary | ICD-10-CM | POA: Insufficient documentation

## 2013-12-09 DIAGNOSIS — Z8619 Personal history of other infectious and parasitic diseases: Secondary | ICD-10-CM | POA: Insufficient documentation

## 2013-12-09 DIAGNOSIS — J45901 Unspecified asthma with (acute) exacerbation: Secondary | ICD-10-CM | POA: Insufficient documentation

## 2013-12-09 MED ORDER — PREDNISONE 10 MG PO TABS: 60.0000 mg | ORAL_TABLET | Freq: Every day | ORAL | Status: DC

## 2013-12-09 MED ORDER — PREDNISONE 20 MG PO TABS
60.0000 mg | ORAL_TABLET | Freq: Once | ORAL | Status: AC
  Administered 2013-12-09: 60 mg via ORAL
  Filled 2013-12-09: qty 3

## 2013-12-09 MED ORDER — IPRATROPIUM BROMIDE 0.02 % IN SOLN
0.5000 mg | Freq: Once | RESPIRATORY_TRACT | Status: AC
  Administered 2013-12-09: 0.5 mg via RESPIRATORY_TRACT
  Filled 2013-12-09: qty 2.5

## 2013-12-09 MED ORDER — ALBUTEROL SULFATE HFA 108 (90 BASE) MCG/ACT IN AERS
2.0000 | INHALATION_SPRAY | Freq: Once | RESPIRATORY_TRACT | Status: AC
  Administered 2013-12-09: 2 via RESPIRATORY_TRACT
  Filled 2013-12-09: qty 6.7

## 2013-12-09 MED ORDER — ALBUTEROL SULFATE HFA 108 (90 BASE) MCG/ACT IN AERS: 2.0000 | INHALATION_SPRAY | RESPIRATORY_TRACT | Status: DC | PRN

## 2013-12-09 MED ORDER — ACETAMINOPHEN 325 MG PO TABS
650.0000 mg | ORAL_TABLET | Freq: Once | ORAL | Status: AC
  Administered 2013-12-09: 650 mg via ORAL
  Filled 2013-12-09: qty 2

## 2013-12-09 MED ORDER — ALBUTEROL SULFATE (2.5 MG/3ML) 0.083% IN NEBU
5.0000 mg | INHALATION_SOLUTION | Freq: Once | RESPIRATORY_TRACT | Status: AC
  Administered 2013-12-09: 5 mg via RESPIRATORY_TRACT
  Filled 2013-12-09: qty 6

## 2013-12-09 NOTE — ED Provider Notes (Signed)
CSN: [id number]     Arrival date & time 12/09/13  1337 History   First MD Initiated Contact with Patient 12/09/13 1350     Chief Complaint  Patient presents with  . Shortness of Breath  . Asthma      HPI Patient presents to the emergency department is with increased wheezing and shortness of breath.  History of asthma.  He was mowing the grass when this began.  EMS gave him a breathing treatment in route with some improvement in his symptoms.  He also received 125 mg of IV Solu-Medrol.  He feels better now.  Still with some shortness of breath.  Feels like he needs another treatment.  No other complaints.  No fevers or chills.   Past Medical History  Diagnosis Date  . Depression 07/07/2011  . Suicide attempt 07/07/2011  . ADHD (attention deficit hyperactivity disorder) 07/07/2011  . Chlamydia infection 07/07/2011  . History of chickenpox   . Positive TB test   . Hernia   . Asthma     takes albuterol  . Asthma 07/07/2011  . Bronchitis 2008   Past Surgical History  Procedure Laterality Date  . Wisdom tooth extraction  2008  . Ventral hernia repair  09/20/2011    Procedure: HERNIA REPAIR VENTRAL ADULT;  Surgeon: Rulon AbideBrian David Layton, DO;  [facility];  Service: General;  Laterality: N/A;   with possible mesh   Family History  Problem Relation Age of Onset  . Arthritis Other     Grandparent  . Hypertension Other     Parent, Grandparent  . Anesthesia problems Father   . Hypotension Neg Hx   . Malignant hyperthermia Neg Hx   . Pseudochol deficiency Neg Hx    History  Substance Use Topics  . Smoking status: Never Smoker   . Smokeless tobacco: Never Used  . Alcohol Use: Yes     Comment: Social drink    Review of Systems  All other systems reviewed and are negative.     Allergies  Review of patient's allergies indicates no known allergies.  Home Medications   Prior to Admission medications   Medication Sig Start Date End Date Taking? Authorizing Provider   albuterol (PROVENTIL HFA;VENTOLIN HFA) 108 (90 BASE) MCG/ACT inhaler Inhale 2 puffs into the lungs every 4 (four) hours as needed for wheezing or shortness of breath. 12/09/13   Lyanne CoKevin M Emmamae Mcnamara, MD  predniSONE (DELTASONE) 10 MG tablet Take 6 tablets (60 mg total) by mouth daily. 12/09/13   Lyanne CoKevin M Ellorie Kindall, MD   BP 151/86  Pulse 122  Temp(Src) 98.7 F (37.1 C) (Oral)  Resp 20  SpO2 100% Physical Exam  Nursing note and vitals reviewed. Constitutional: He is oriented to person, place, and time. He appears well-developed and well-nourished.  HENT:  Head: Normocephalic and atraumatic.  Eyes: EOM are normal.  Neck: Normal range of motion.  Cardiovascular: Normal rate, regular rhythm, normal heart sounds and intact distal pulses.   Pulmonary/Chest: Effort normal. No respiratory distress. He has wheezes.  Abdominal: Soft. He exhibits no distension. There is no tenderness.  Musculoskeletal: Normal range of motion.  Neurological: He is alert and oriented to person, place, and time.  Skin: Skin is warm and dry.  Psychiatric: He has a normal mood and affect. Judgment normal.    ED Course  Procedures (including critical care time) Labs Review Labs Reviewed - No data to display  Imaging Review No results found.   EKG Interpretation None  MDM   Final diagnoses:  Asthma exacerbation    4:15 PM Patient feels better after another breathing treatment in the emergency department.  Discharge home in good condition.  Home with steroids.  Home with an albuterol inhaler.  He understands to return to the ER for new or worsening symptoms    Lyanne CoKevin M Akacia Boltz, MD 12/09/13 1615

## 2013-12-09 NOTE — ED Notes (Signed)
Bed: WA07 Expected date:  Expected time:  Means of arrival:  Comments: EMS-asthma

## 2013-12-09 NOTE — Discharge Instructions (Signed)
Asthma, Adult Asthma is a recurring condition in which the airways tighten and narrow. Asthma can make it difficult to breathe. It can cause coughing, wheezing, and shortness of breath. Asthma episodes (also called asthma attacks) range from minor to life-threatening. Asthma cannot be cured, but medicines and lifestyle changes can help control it. CAUSES Asthma is believed to be caused by inherited (genetic) and environmental factors, but its exact cause is unknown. Asthma may be triggered by allergens, lung infections, or irritants in the air. Asthma triggers are different for each person. Common triggers include:   Animal dander.  Dust mites.  Cockroaches.  Pollen from trees or grass.  Mold.  Smoke.  Air pollutants such as dust, household cleaners, hair sprays, aerosol sprays, paint fumes, strong chemicals, or strong odors.  Cold air, weather changes, and winds (which increase molds and pollens in the air).  Strong emotional expressions such as crying or laughing hard.  Stress.  Certain medicines (such as aspirin) or types of drugs (such as beta-blockers).  Sulfites in foods and drinks. Foods and drinks that may contain sulfites include dried fruit, potato chips, and sparkling grape juice.  Infections or inflammatory conditions such as the flu, a cold, or an inflammation of the nasal membranes (rhinitis).  Gastroesophageal reflux disease (GERD).  Exercise or strenuous activity. SYMPTOMS Symptoms may occur immediately after asthma is triggered or many hours later. Symptoms include:  Wheezing.  Excessive nighttime or early morning coughing.  Frequent or severe coughing with a common cold.  Chest tightness.  Shortness of breath. DIAGNOSIS  The diagnosis of asthma is made by a review of your medical history and a physical exam. Tests may also be performed. These may include:  Lung function studies. These tests show how much air you breath in and out.  Allergy  tests.  Imaging tests such as X-rays. TREATMENT  Asthma cannot be cured, but it can usually be controlled. Treatment involves identifying and avoiding your asthma triggers. It also involves medicines. There are 2 classes of medicine used for asthma treatment:   Controller medicines. These prevent asthma symptoms from occurring. They are usually taken every day.  Reliever or rescue medicines. These quickly relieve asthma symptoms. They are used as needed and provide short-term relief. Your health care provider will help you create an asthma action plan. An asthma action plan is a written plan for managing and treating your asthma attacks. It includes a list of your asthma triggers and how they may be avoided. It also includes information on when medicines should be taken and when their dosage should be changed. An action plan may also involve the use of a device called a peak flow meter. A peak flow meter measures how well the lungs are working. It helps you monitor your condition. HOME CARE INSTRUCTIONS   Take medicine as directed by your health care provider. Speak with your health care provider if you have questions about how or when to take the medicines.  Use a peak flow meter as directed by your health care provider. Record and keep track of readings.  Understand and use the action plan to help minimize or stop an asthma attack without needing to seek medical care.  Control your home environment in the following ways to help prevent asthma attacks:  Do not smoke. Avoid being exposed to secondhand smoke.  Change your heating and air conditioning filter regularly.  Limit your use of fireplaces and wood stoves.  Get rid of pests (such as roaches and   mice) and their droppings.  Throw away plants if you see mold on them.  Clean your floors and dust regularly. Use unscented cleaning products.  Try to have someone else vacuum for you regularly. Stay out of rooms while they are being  vacuumed and for a short while afterward. If you vacuum, use a dust mask from a hardware store, a double-layered or microfilter vacuum cleaner bag, or a vacuum cleaner with a HEPA filter.  Replace carpet with wood, tile, or vinyl flooring. Carpet can trap dander and dust.  Use allergy-proof pillows, mattress covers, and box spring covers.  Wash bed sheets and blankets every week in hot water and dry them in a dryer.  Use blankets that are made of polyester or cotton.  Clean bathrooms and kitchens with bleach. If possible, have someone repaint the walls in these rooms with mold-resistant paint. Keep out of the rooms that are being cleaned and painted.  Wash hands frequently. SEEK MEDICAL CARE IF:   You have wheezing, shortness of breath, or a cough even if taking medicine to prevent attacks.  The colored mucus you cough up (sputum) is thicker than usual.  Your sputum changes from clear or white to yellow, green, gray, or bloody.  You have any problems that may be related to the medicines you are taking (such as a rash, itching, swelling, or trouble breathing).  You are using a reliever medicine more than 2 3 times per week.  Your peak flow is still at 50 79% of you personal best after following your action plan for 1 hour. SEEK IMMEDIATE MEDICAL CARE IF:   You seem to be getting worse and are unresponsive to treatment during an asthma attack.  You are short of breath even at rest.  You get short of breath when doing very little physical activity.  You have difficulty eating, drinking, or talking due to asthma symptoms.  You develop chest pain.  You develop a fast heartbeat.  You have a bluish color to your lips or fingernails.  You are lightheaded, dizzy, or faint.  Your peak flow is less than 50% of your personal best.  You have a fever or persistent symptoms for more than 2 3 days.  You have a fever and symptoms suddenly get worse. MAKE SURE YOU:   Understand these  instructions.  Will watch your condition.  Will get help right away if you are not doing well or get worse. Document Released: 07/09/2005 Document Revised: 03/11/2013 Document Reviewed: 02/05/2013 Exit[employer] Patient Information 2014 [employer].  

## 2013-12-09 NOTE — ED Notes (Signed)
Per EMS, pt states he began feeling short of breath and wheezing today while mowing the grass. Pt has hx of asthma, states he has felt sick for the past 3 weeks--states he took prednisone for 1 week when he became sick, which helped with his symptoms. EMS gave pt 10mg  albuterol/.5mg  atrovent breathing treatment, 125mg  solumedrol IV. Pt states he "didn't feel like I could breath" before EMS treatment, now states he can breath.

## 2013-12-09 NOTE — Progress Notes (Signed)
[facility] CL provided pt with a list of primary care resources. Patient stated that he was pending insurance through job in August. Patient asked CL if there were any resources on counseling or therapy. Provided pt with information on [facility], [facility], and [facility], as well as, a list of other counseling services.

## 2014-04-09 ENCOUNTER — Encounter (HOSPITAL_COMMUNITY): Payer: Self-pay | Admitting: Emergency Medicine

## 2014-04-09 ENCOUNTER — Emergency Department (HOSPITAL_COMMUNITY): Payer: PRIVATE HEALTH INSURANCE

## 2014-04-09 ENCOUNTER — Emergency Department (HOSPITAL_COMMUNITY)
Admission: EM | Admit: 2014-04-09 | Discharge: 2014-04-10 | Disposition: A | Discharge status: Home / Self Care | Payer: PRIVATE HEALTH INSURANCE | Attending: Emergency Medicine | Admitting: Emergency Medicine

## 2014-04-09 DIAGNOSIS — Z8659 Personal history of other mental and behavioral disorders: Secondary | ICD-10-CM | POA: Insufficient documentation

## 2014-04-09 DIAGNOSIS — S93402A Sprain of unspecified ligament of left ankle, initial encounter: Secondary | ICD-10-CM

## 2014-04-09 DIAGNOSIS — Z8719 Personal history of other diseases of the digestive system: Secondary | ICD-10-CM | POA: Insufficient documentation

## 2014-04-09 DIAGNOSIS — Z8619 Personal history of other infectious and parasitic diseases: Secondary | ICD-10-CM | POA: Insufficient documentation

## 2014-04-09 DIAGNOSIS — S93409A Sprain of unspecified ligament of unspecified ankle, initial encounter: Secondary | ICD-10-CM | POA: Insufficient documentation

## 2014-04-09 DIAGNOSIS — W1789XA Other fall from one level to another, initial encounter: Secondary | ICD-10-CM | POA: Insufficient documentation

## 2014-04-09 DIAGNOSIS — S99929A Unspecified injury of unspecified foot, initial encounter: Secondary | ICD-10-CM

## 2014-04-09 DIAGNOSIS — S8990XA Unspecified injury of unspecified lower leg, initial encounter: Secondary | ICD-10-CM | POA: Insufficient documentation

## 2014-04-09 DIAGNOSIS — Y9289 Other specified places as the place of occurrence of the external cause: Secondary | ICD-10-CM | POA: Insufficient documentation

## 2014-04-09 DIAGNOSIS — Z79899 Other long term (current) drug therapy: Secondary | ICD-10-CM | POA: Insufficient documentation

## 2014-04-09 DIAGNOSIS — M25572 Pain in left ankle and joints of left foot: Secondary | ICD-10-CM

## 2014-04-09 DIAGNOSIS — J45909 Unspecified asthma, uncomplicated: Secondary | ICD-10-CM | POA: Insufficient documentation

## 2014-04-09 DIAGNOSIS — S99919A Unspecified injury of unspecified ankle, initial encounter: Secondary | ICD-10-CM

## 2014-04-09 DIAGNOSIS — M25472 Effusion, left ankle: Secondary | ICD-10-CM

## 2014-04-09 DIAGNOSIS — Y9339 Activity, other involving climbing, rappelling and jumping off: Secondary | ICD-10-CM | POA: Insufficient documentation

## 2014-04-09 MED ORDER — HYDROCODONE-ACETAMINOPHEN 5-325 MG PO TABS: 1.0000 | ORAL_TABLET | Freq: Four times a day (QID) | ORAL | Status: DC | PRN

## 2014-04-09 MED ORDER — HYDROCODONE-ACETAMINOPHEN 5-325 MG PO TABS
1.0000 | ORAL_TABLET | Freq: Once | ORAL | Status: AC
  Administered 2014-04-09: 1 via ORAL
  Filled 2014-04-09: qty 1

## 2014-04-09 MED ORDER — NAPROXEN 500 MG PO TABS: 500.0000 mg | ORAL_TABLET | Freq: Two times a day (BID) | ORAL | Status: DC | PRN

## 2014-04-09 NOTE — ED Provider Notes (Signed)
CSN: [id number]     Arrival date & time 04/09/14  2128 History  This chart was scribed for non-physician practitioner, Allen Derry, PA-C working with Toy Baker, MD by Gwenyth Ober, ED scribe. This patient was seen in room WTR6/WTR6 and the patient's care was started at 10:34 PM   Chief Complaint  Patient presents with  . Ankle Pain   Patient is a 23 y.o. male presenting with ankle pain. The history is provided by the patient. No language interpreter was used.  Ankle Pain Location:  Ankle Time since incident:  10 hours Injury: yes   Mechanism of injury: fall   Fall:    Fall occurred:  Jumping from height   Impact surface: step stool.   Point of impact:  Feet   Entrapped after fall: no   Ankle location:  R ankle Pain details:    Quality:  Aching and sharp   Radiates to:  L leg   Severity:  Severe   Onset quality:  Sudden   Duration:  10 hours   Timing:  Constant   Progression:  Unchanged Chronicity:  New Dislocation: no   Foreign body present:  No foreign bodies Tetanus status:  Unknown Prior injury to area:  No Relieved by:  Nothing Worsened by:  Bearing weight, activity, flexion, rotation, adduction, abduction and extension Ineffective treatments:  Elevation, ice, muscle relaxant and rest Associated symptoms: no numbness and no tingling    HPI Comments: Earl Compton is a 23 y.o. male who presents to the Emergency Department complaining of 10/10, constant, sharp, aching left ankle pain that radiates up his left lower leg after he jumped off of a truck bed 10 hours ago and landed on a step stool, inverting his ankle. Pt states the pain became worse when the nurse took his shoe off. Pt is able to ambulate after the incident, but limps due to pain. He took a muscle relaxer and attempted RICE therapy with no relief to symptoms. Pt denies tingling and numbness in his left foot, knee pain, erythema or warmth to the area, or weakness. Pt has no prior, serious  history of injury to this area.   Past Medical History  Diagnosis Date  . Depression 07/07/2011  . Suicide attempt 07/07/2011  . ADHD (attention deficit hyperactivity disorder) 07/07/2011  . Chlamydia infection 07/07/2011  . History of chickenpox   . Positive TB test   . Hernia   . Asthma     takes albuterol  . Asthma 07/07/2011  . Bronchitis 2008   Past Surgical History  Procedure Laterality Date  . Wisdom tooth extraction  2008  . Ventral hernia repair  09/20/2011    Procedure: HERNIA REPAIR VENTRAL ADULT;  Surgeon: Rulon Abide, DO;  Location: [facility] OR;  Service: General;  Laterality: N/A;   with possible mesh   Family History  Problem Relation Age of Onset  . Arthritis Other     Grandparent  . Hypertension Other     Parent, Grandparent  . Anesthesia problems Father   . Hypotension Neg Hx   . Malignant hyperthermia Neg Hx   . Pseudochol deficiency Neg Hx    History  Substance Use Topics  . Smoking status: Never Smoker   . Smokeless tobacco: Never Used  . Alcohol Use: Yes     Comment: Social drink    Review of Systems  Cardiovascular: Negative for leg swelling.  Musculoskeletal: Positive for arthralgias and joint swelling.  Skin: Negative for color change  and wound.  Neurological: Negative for weakness and numbness.   10 Systems reviewed and all are negative for acute change except as noted in the HPI.       Allergies  Review of patient's allergies indicates no known allergies.  Home Medications   Prior to Admission medications   Medication Sig Start Date End Date Taking? Authorizing Provider  albuterol (PROVENTIL HFA;VENTOLIN HFA) 108 (90 BASE) MCG/ACT inhaler Inhale 2 puffs into the lungs every 4 (four) hours as needed for wheezing or shortness of breath. 12/09/13  Yes Lyanne Co, MD   BP 135/85  Pulse 85  Temp(Src) 98.7 F (37.1 C) (Oral)  Resp 20  Ht  (1.956 m)  Wt 195 lb (88.451 kg)  BMI 23.12 kg/m2  SpO2 99% Physical Exam   Constitutional: He is oriented to person, place, and time. Vital signs are normal. He appears well-developed and well-nourished. No distress.  HENT:  Head: Normocephalic and atraumatic.  Mouth/Throat: Mucous membranes are normal.  Eyes: Conjunctivae and EOM are normal.  Neck: Normal range of motion. Neck supple. No spinous process tenderness and no muscular tenderness present. No rigidity. No tracheal deviation and normal range of motion present.  Cardiovascular: Normal rate and intact distal pulses.   Distal pulses intact  Pulmonary/Chest: Effort normal. No respiratory distress.  Abdominal: Normal appearance. He exhibits no distension.  Musculoskeletal:       Left ankle: He exhibits decreased range of motion (due to pain) and swelling (over lateral aspect of ankle). He exhibits no ecchymosis, no deformity and normal pulse. Tenderness. Lateral malleolus, AITFL and CF ligament tenderness found. Achilles tendon normal.  L ankle with decreased ROM secondary to pain, mild swelling over lateral ankle just distal to the lateral malleolus, with TTP over this area and the lateral malleolus. No ecchymosis or deformity, distal pulses intact, neg high-ankle squeeze test. Achilles tendon WNL. Gait antalgic. Distal pulses intact, sensation grossly intact, strength limited secondary to pain but able to still provide some strength against resistance.   Neurological: He is alert and oriented to person, place, and time. No sensory deficit. Gait (antalgic) abnormal.  Antalgic gait, strength limited in L ankle due to pain  Skin: Skin is warm, dry and intact. No bruising and no rash noted. No erythema.  Psychiatric: He has a normal mood and affect. His behavior is normal.    ED Course  Procedures (including critical care time) DIAGNOSTIC STUDIES: Oxygen Saturation is 99% on RA, normal by my interpretation.    COORDINATION OF CARE: 10:38 PM- Will order x-ray of the right ankle and administer pain medication in  the ED. Pt agrees to the treatment plan.     Labs Review Labs Reviewed - No data to display  Imaging Review Dg Ankle Complete Left  04/09/2014   CLINICAL DATA:  Twisting injury to the left ankle earlier tonight. Pain and swelling. Initial encounter.  EXAM: LEFT ANKLE COMPLETE - 3+ VIEW  COMPARISON:  11/22/2010.  FINDINGS: No evidence of acute fracture. Ankle mortise intact with well-preserved joint space. Well-preserved bone mineral density. No intrinsic osseous abnormalities. No visible joint effusion.  IMPRESSION: Normal examination.   Electronically Signed   By: Hulan Saas M.D.   On: 04/09/2014 22:55     EKG Interpretation None      MDM   Final diagnoses:  Ankle sprain, left, initial encounter  Left ankle swelling  Left ankle pain   22y/o male with L ankle pain after inversion injury, xray neg for  fx. Will treat as sprain. Given ASO brace and crutches, pain meds here and rx for meds to go home with. Discussed RICE therapy and high-top shoes for support, discussed crutches as needed for weight bearing and f/up with ortho in 2 weeks if pain unimproved. I explained the diagnosis and have given explicit precautions to return to the ER including for any other new or worsening symptoms. The patient understands and accepts the medical plan as it's been dictated and I have answered their questions. Discharge instructions concerning home care and prescriptions have been given. The patient is STABLE and is discharged to home in good condition.   I personally performed the services described in this documentation, which was scribed in my presence. The recorded information has been reviewed and is accurate.  BP 135/85  Pulse 85  Temp(Src) 98.7 F (37.1 C) (Oral)  Resp 20  Ht  (1.956 m)  Wt 195 lb (88.451 kg)  BMI 23.12 kg/m2  SpO2 99%  Meds ordered this encounter  Medications  . HYDROcodone-acetaminophen (NORCO/VICODIN) 5-325 MG per tablet 1 tablet    Sig:   .  HYDROcodone-acetaminophen (NORCO) 5-325 MG per tablet    Sig: Take 1-2 tablets by mouth every 6 (six) hours as needed for severe pain.    Dispense:  10 tablet    Refill:  0    Order Specific Question:  Supervising Provider    Answer:  Eber Hong D [[id number]]  . naproxen (NAPROSYN) 500 MG tablet    Sig: Take 1 tablet (500 mg total) by mouth 2 (two) times daily as needed for mild pain, moderate pain or headache (TAKE WITH MEALS.).    Dispense:  20 tablet    Refill:  0    Order Specific Question:  Supervising Provider    Answer:  Vida Roller [address] Marvelene Stoneberg Camprubi-Soms, PA-C 04/09/14 2345

## 2014-04-09 NOTE — ED Notes (Signed)
Pt states he jumped from the back of a bed of a truck landed on a step stool with ankle twisted, pt states he heard a pop.

## 2014-04-09 NOTE — ED Notes (Signed)
Ortho requested to bedside for ASO and crutches

## 2014-04-09 NOTE — Discharge Instructions (Signed)
Wear ankle brace for at least 2 weeks for stabilization of ankle. Use crutches as needed for comfort. Ice and elevate ankle throughout the day. Alternate between naprosyn and norco for pain relief. Do not drive or operate machinery with pain medication use. Call orthopedic follow up today or tomorrow to schedule followup appointment for recheck of ongoing ankle pain that can be canceled with a 24-48 hour notice if complete resolution of pain. Return to the ER for changes or worsening symptoms.   Ankle Sprain An ankle sprain is an injury to the strong, fibrous tissues (ligaments) that hold your ankle bones together.  HOME CARE   Put ice on your ankle for 1-2 days or as told by your doctor.  Put ice in a plastic bag.  Place a towel between your skin and the bag.  Leave the ice on for 15-20 minutes at a time, every 2 hours while you are awake.  Only take medicine as told by your doctor.  Raise (elevate) your injured ankle above the level of your heart as much as possible for 2-3 days.  Use crutches if your doctor tells you to. Slowly put your own weight on the affected ankle. Use the crutches until you can walk without pain.  If you have a plaster splint:  Do not rest it on anything harder than a pillow for 24 hours.  Do not put weight on it.  Do not get it wet.  Take it off to shower or bathe.  If given, use an elastic wrap or support stocking for support. Take the wrap off if your toes lose feeling (numb), tingle, or turn cold or blue.  If you have an air splint:  Add or let out air to make it comfortable.  Take it off at night and to shower and bathe.  Wiggle your toes and move your ankle up and down often while you are wearing it. GET HELP IF:  You have rapidly increasing bruising or puffiness (swelling).  Your toes feel very cold.  You lose feeling in your foot.  Your medicine does not help your pain. GET HELP RIGHT AWAY IF:   Your toes lose feeling (numb) or turn  blue.  You have severe pain that is increasing. MAKE SURE YOU:   Understand these instructions.  Will watch your condition.  Will get help right away if you are not doing well or get worse. Document Released: 12/26/2007 Document Revised: 11/23/2013 Document Reviewed: 01/21/2012 [facility] Patient Information 2015 [place], [place]. This information is not intended to replace advice given to you by your health care provider. Make sure you discuss any questions you have with your health care provider.  Cryotherapy Cryotherapy is when you put ice on your injury. Ice helps lessen pain and puffiness (swelling) after an injury. Ice works the best when you start using it in the first 24 to 48 hours after an injury. HOME CARE  Put a dry or damp towel between the ice pack and your skin.  You may press gently on the ice pack.  Leave the ice on for no more than 10 to 20 minutes at a time.  Check your skin after 5 minutes to make sure your skin is okay.  Rest at least 20 minutes between ice pack uses.  Stop using ice when your skin loses feeling (numbness).  Do not use ice on someone who cannot tell you when it hurts. This includes small children and people with memory problems (dementia). GET HELP RIGHT  AWAY IF:  You have white spots on your skin.  Your skin turns blue or pale.  Your skin feels waxy or hard.  Your puffiness gets worse. MAKE SURE YOU:   Understand these instructions.  Will watch your condition.  Will get help right away if you are not doing well or get worse. Document Released: 12/26/2007 Document Revised: 10/01/2011 Document Reviewed: 03/01/2011 [facility] Patient Information 2015 [place], [place]. This information is not intended to replace advice given to you by your health care provider. Make sure you discuss any questions you have with your health care provider.  Stirrup Ankle Brace Stirrup ankle braces give support and help stabilize the ankle joint. They are  rigid pieces of plastic or fiberglass that go up both sides of the lower leg with the bottom of the stirrup fitting comfortably under the bottom of the instep of the foot. It can be held on with Velcro straps or an elastic wrap. Stirrup ankle braces are used to support the ankle following mild or moderate sprains or strains, or fractures after cast removal.  They can be easily removed or adjusted if there is swelling. The rigid brace shells are designed to fit the ankle comfortably and provide the needed medial/lateral stabilization. This brace can be easily worn with most athletic shoes. The brace liner is usually made of a soft, comfortable gel-like material. This gel fits the ankle well without causing uncomfortable pressure points.  IMPORTANCE OF ANKLE BRACES:  The use of ankle bracing is effective in the prevention of ankle sprains.  In athletes, the use of ankle bracing will offer protection and prevent further sprains.  Research shows that a complete rehabilitation program needs to be included with external bracing. This includes range of motion and ankle strengthening exercises. Your caregivers will instruct you in this. If you were given the brace today for a new injury, use the following home care instructions as a guide. HOME CARE INSTRUCTIONS   Apply ice to the sore area for 15-20 minutes, 03-04 times per day while awake for the first 2 days. Put the ice in a plastic bag and place a towel between the bag of ice and your skin. Never place the ice pack directly on your skin. Be especially careful using ice on an elbow or knee or other bony area, such as your ankle, because icing for too long may damage the nerves which are close to the surface.  Keep your leg elevated when possible to lessen swelling.  Wear your splint until you are seen for a follow-up examination. Do not put weight on it. Do not get it wet. You may take it off to take a shower or bath.  For Activity: Use crutches with  non-weight bearing for 1 week. Then, you may walk on your ankle as instructed. Start gradually with weight bearing on the affected ankle.  Continue to use crutches or a cane until you can stand on your ankle without causing pain.  Wiggle your toes in the splint several times per day if you are able.  The splint is too tight if you have numbness, tingling, or if your foot becomes cold and blue. Adjust the straps or elastic bandage to make it comfortable.  Only take over-the-counter or prescription medicines for pain, discomfort, or fever as directed by your caregiver. SEEK IMMEDIATE MEDICAL CARE IF:   You have increased bruising, swelling or pain.  Your toes are blue or cold and loosening the brace or wrap does not help.  Your pain is not relieved with medicine. MAKE SURE YOU:   Understand these instructions.  Will watch your condition.  Will get help right away if you are not doing well or get worse. Document Released: 05/09/2004 Document Revised: 10/01/2011 Document Reviewed: 10/11/2008 [facility] Patient Information 2015 [place], [place]. This information is not intended to replace advice given to you by your health care provider. Make sure you discuss any questions you have with your health care provider.

## 2014-04-09 NOTE — Progress Notes (Signed)
Orthopedic Tech Progress Note Patient Details:  JUDAH CARCHI 1991/03/20 [id number] Applied ASO to LLE.  Pulses, sensation, motion intact before and after application.  Capillary refill less than 2 seconds before and after application.  Fit crutches and taught pt. use of same. Ortho Devices Type of Ortho Device: ASO Ortho Device/Splint Location: LLE Ortho Device/Splint Interventions: Application   Lesle Chris 04/09/2014, 11:49 PM

## 2014-04-10 NOTE — ED Provider Notes (Signed)
Medical screening examination/treatment/procedure(s) were performed by non-physician practitioner and as supervising physician I was immediately available for consultation/collaboration.   EKG Interpretation None       Toy Baker, MD 04/10/14 1640

## 2014-09-15 ENCOUNTER — Emergency Department (HOSPITAL_COMMUNITY)
Admission: EM | Admit: 2014-09-15 | Discharge: 2014-09-15 | Disposition: A | Discharge status: Home / Self Care | Payer: PRIVATE HEALTH INSURANCE | Attending: Emergency Medicine | Admitting: Emergency Medicine

## 2014-09-15 ENCOUNTER — Encounter (HOSPITAL_COMMUNITY): Payer: Self-pay | Admitting: Emergency Medicine

## 2014-09-15 DIAGNOSIS — Z79899 Other long term (current) drug therapy: Secondary | ICD-10-CM | POA: Insufficient documentation

## 2014-09-15 DIAGNOSIS — Z8659 Personal history of other mental and behavioral disorders: Secondary | ICD-10-CM | POA: Insufficient documentation

## 2014-09-15 DIAGNOSIS — Z8619 Personal history of other infectious and parasitic diseases: Secondary | ICD-10-CM | POA: Insufficient documentation

## 2014-09-15 DIAGNOSIS — R69 Illness, unspecified: Secondary | ICD-10-CM

## 2014-09-15 DIAGNOSIS — J45909 Unspecified asthma, uncomplicated: Secondary | ICD-10-CM | POA: Insufficient documentation

## 2014-09-15 DIAGNOSIS — J111 Influenza due to unidentified influenza virus with other respiratory manifestations: Secondary | ICD-10-CM | POA: Insufficient documentation

## 2014-09-15 DIAGNOSIS — Z791 Long term (current) use of non-steroidal anti-inflammatories (NSAID): Secondary | ICD-10-CM | POA: Insufficient documentation

## 2014-09-15 NOTE — Discharge Instructions (Signed)
Take Tylenol as directed every 4 hours while awake for aches or for temperature higher than 100.4. Make sure that you didn't drink lots of fluids, at least 6six 8 ounce glasses of water daily . See Dr.John in the office if you're not feeling better by next week. Return for shortness of breath, or if you feel worse for any reason.

## 2014-09-15 NOTE — ED Provider Notes (Signed)
CSN: [id number]     Arrival date & time 09/15/14  [id number] History   First MD Initiated Contact with Patient 09/15/14 [phone]     Chief Complaint  Patient presents with  . flu like symptoms      (Consider location/radiation/quality/duration/timing/severity/associated sxs/prior Treatment) HPI Complains of productive cough, nasal congestion, sneezing, sore throat, headache, diffuse myalgias onset 2 weeks ago. Patient had temperature of 102 last night. Treated with Sudafed with partial relief. No nausea or vomiting No other associated symptoms. No shortness of breath. Nothing makes symptoms better or worse. Past Medical History  Diagnosis Date  . Depression 07/07/2011  . Suicide attempt 07/07/2011  . ADHD (attention deficit hyperactivity disorder) 07/07/2011  . Chlamydia infection 07/07/2011  . History of chickenpox   . Positive TB test   . Hernia   . Asthma     takes albuterol  . Asthma 07/07/2011  . Bronchitis 2008   Past Surgical History  Procedure Laterality Date  . Wisdom tooth extraction  2008  . Ventral hernia repair  09/20/2011    Procedure: HERNIA REPAIR VENTRAL ADULT;  Surgeon: Rulon AbideBrian David Layton, DO;  [facility];  Service: General;  Laterality: N/A;   with possible mesh   Family History  Problem Relation Age of Onset  . Arthritis Other     Grandparent  . Hypertension Other     Parent, Grandparent  . Anesthesia problems Father   . Hypotension Neg Hx   . Malignant hyperthermia Neg Hx   . Pseudochol deficiency Neg Hx    History  Substance Use Topics  . Smoking status: Never Smoker   . Smokeless tobacco: Never Used  . Alcohol Use: Yes     Comment: Social drink    Review of Systems  Constitutional: Positive for fever.  HENT: Positive for congestion and sneezing.   Respiratory: Positive for cough.   Cardiovascular: Negative.   Gastrointestinal: Negative.   Musculoskeletal: Positive for myalgias.  Skin: Negative.   Neurological: Negative.    Psychiatric/Behavioral: Negative.   All other systems reviewed and are negative.     Allergies  Review of patient's allergies indicates no known allergies.  Home Medications   Prior to Admission medications   Medication Sig Start Date End Date Taking? Authorizing Provider  albuterol (PROVENTIL HFA;VENTOLIN HFA) 108 (90 BASE) MCG/ACT inhaler Inhale 2 puffs into the lungs every 4 (four) hours as needed for wheezing or shortness of breath. 12/09/13   Lyanne CoKevin M Campos, MD  HYDROcodone-acetaminophen (NORCO) 5-325 MG per tablet Take 1-2 tablets by mouth every 6 (six) hours as needed for severe pain. 04/09/14   Mercedes Strupp Camprubi-Soms, PA-C  naproxen (NAPROSYN) 500 MG tablet Take 1 tablet (500 mg total) by mouth 2 (two) times daily as needed for mild pain, moderate pain or headache (TAKE WITH MEALS.). 04/09/14   Mercedes Strupp Camprubi-Soms, PA-C   There were no vitals taken for this visit. Physical Exam  Constitutional: He appears well-developed and well-nourished.  HENT:  Head: Normocephalic and atraumatic.  Right Ear: External ear normal.  Left Ear: External ear normal.  Mouth/Throat: Oropharynx is clear and moist.  Bilateral tympanic membranes normal. Nasal congestion.  Eyes: Conjunctivae are normal. Pupils are equal, round, and reactive to light.  Neck: Neck supple. No tracheal deviation present. No thyromegaly present.  Cardiovascular: Normal rate and regular rhythm.   No murmur heard. Pulmonary/Chest: Effort normal and breath sounds normal.  Abdominal: Soft. Bowel sounds are normal. He exhibits no distension. There is no tenderness.  Musculoskeletal: Normal range of motion. He exhibits no edema or tenderness.  Neurological: He is alert. Coordination normal.  Skin: Skin is warm and dry. No rash noted.  Psychiatric: He has a normal mood and affect.  Nursing note and vitals reviewed.   ED Course  Procedures (including critical care time) Labs Review Labs Reviewed - No data  to display  Imaging Review No results found.   EKG Interpretation None      MDM  Imaging not indicated., Clear lungs, normal pulse ox, normal respiratory rate Final diagnoses:  None   Patient exhibiting influenza-like illness. Plan follow-up with Dr. Jonny Ruiz if not better next week Diagnosis influenza-like illness    Doug Sou, MD 09/15/14 1030

## 2014-09-15 NOTE — ED Notes (Signed)
MD at bedside. 

## 2014-09-15 NOTE — ED Notes (Signed)
Pt instructed to get dressed. 

## 2014-09-15 NOTE — ED Notes (Signed)
Pt from home c/o cough with yellow expectorant, fever, and chills for several days.

## 2014-12-02 ENCOUNTER — Encounter (HOSPITAL_COMMUNITY): Payer: Self-pay

## 2014-12-02 ENCOUNTER — Emergency Department (HOSPITAL_COMMUNITY)
Admission: EM | Admit: 2014-12-02 | Discharge: 2014-12-02 | Discharge status: Against Medical Advice | Payer: PRIVATE HEALTH INSURANCE | Attending: Emergency Medicine | Admitting: Emergency Medicine

## 2014-12-02 DIAGNOSIS — J45901 Unspecified asthma with (acute) exacerbation: Secondary | ICD-10-CM | POA: Insufficient documentation

## 2014-12-02 MED ORDER — IPRATROPIUM-ALBUTEROL 0.5-2.5 (3) MG/3ML IN SOLN
3.0000 mL | Freq: Once | RESPIRATORY_TRACT | Status: AC
Start: 2014-12-02 — End: 2014-12-02
  Administered 2014-12-02: 3 mL via RESPIRATORY_TRACT
  Filled 2014-12-02: qty 3

## 2014-12-02 NOTE — ED Notes (Signed)
Pt states shortness of breath since this morning.  Pt has asthma.  Inhaler is now empty.  Pt able to talk in full sentences.  No obvious distress noted

## 2014-12-02 NOTE — ED Notes (Signed)
Pt stated that he was feeling better and did not feel a need to continue to await assessment by PA. Pt encouraged to stay , he refused. Pt encouraged to return to ED should sx arise. Pt signed out ama

## 2015-03-07 DIAGNOSIS — Z8659 Personal history of other mental and behavioral disorders: Secondary | ICD-10-CM | POA: Insufficient documentation

## 2015-03-07 DIAGNOSIS — J45909 Unspecified asthma, uncomplicated: Secondary | ICD-10-CM | POA: Insufficient documentation

## 2015-03-07 DIAGNOSIS — K0381 Cracked tooth: Secondary | ICD-10-CM | POA: Insufficient documentation

## 2015-03-07 DIAGNOSIS — Z8619 Personal history of other infectious and parasitic diseases: Secondary | ICD-10-CM | POA: Insufficient documentation

## 2015-03-07 DIAGNOSIS — K088 Other specified disorders of teeth and supporting structures: Secondary | ICD-10-CM | POA: Insufficient documentation

## 2015-03-07 DIAGNOSIS — Z79899 Other long term (current) drug therapy: Secondary | ICD-10-CM | POA: Insufficient documentation

## 2015-03-07 DIAGNOSIS — Z8719 Personal history of other diseases of the digestive system: Secondary | ICD-10-CM | POA: Insufficient documentation

## 2015-03-07 DIAGNOSIS — Z8611 Personal history of tuberculosis: Secondary | ICD-10-CM | POA: Insufficient documentation

## 2015-03-08 ENCOUNTER — Encounter (HOSPITAL_COMMUNITY): Payer: Self-pay | Admitting: Emergency Medicine

## 2015-03-08 ENCOUNTER — Emergency Department (HOSPITAL_COMMUNITY)
Admission: EM | Admit: 2015-03-08 | Discharge: 2015-03-08 | Disposition: A | Discharge status: Home / Self Care | Payer: PRIVATE HEALTH INSURANCE | Attending: Emergency Medicine | Admitting: Emergency Medicine

## 2015-03-08 DIAGNOSIS — K0889 Other specified disorders of teeth and supporting structures: Secondary | ICD-10-CM

## 2015-03-08 MED ORDER — MELOXICAM 15 MG PO TABS
15.0000 mg | ORAL_TABLET | Freq: Once | ORAL | Status: AC
  Administered 2015-03-08: 15 mg via ORAL
  Filled 2015-03-08: qty 1

## 2015-03-08 MED ORDER — PENICILLIN V POTASSIUM 500 MG PO TABS
500.0000 mg | ORAL_TABLET | Freq: Once | ORAL | Status: AC
  Administered 2015-03-08: 500 mg via ORAL
  Filled 2015-03-08: qty 1

## 2015-03-08 MED ORDER — PENICILLIN V POTASSIUM 500 MG PO TABS: 500.0000 mg | ORAL_TABLET | Freq: Four times a day (QID) | ORAL | Status: DC

## 2015-03-08 MED ORDER — MELOXICAM 15 MG PO TABS: 15.0000 mg | ORAL_TABLET | Freq: Every day | ORAL | Status: DC

## 2015-03-08 NOTE — ED Notes (Signed)
MD at bedside. 

## 2015-03-08 NOTE — Discharge Instructions (Signed)
Take mobic as needed for pain. Take Veetid as directed until gone. Refer to attached documents for more information.

## 2015-03-08 NOTE — ED Notes (Signed)
Pain from tooth during the night. Patient unable to sleep. He placed a OTC sealant on tooth. Oragel does not help. Denies pain with eating.

## 2015-03-08 NOTE — ED Provider Notes (Signed)
CSN: [id number]     Arrival date & time 03/07/15  2338 History   First MD Initiated Contact with Patient 03/08/15 0229     Chief Complaint  Patient presents with  . Dental Pain     (Consider location/radiation/quality/duration/timing/severity/associated sxs/prior Treatment) HPI Comments: The patient is a 24 year old otherwise healthy male who presents with dental pain that started gradually yesterday. The dental pain is severe, constant and progressively worsening. The pain is aching and located in right lower gums. The pain does not radiate. Eating makes the pain worse. Nothing makes the pain better. The patient has tried OTC medication without relief. No associated symptoms. Patient denies headache, neck pain/stiffness, fever, NVD, edema, sore throat, throat swelling, wheezing, SOB, chest pain, abdominal pain.      Past Medical History  Diagnosis Date  . Depression 07/07/2011  . Suicide attempt 07/07/2011  . ADHD (attention deficit hyperactivity disorder) 07/07/2011  . Chlamydia infection 07/07/2011  . History of chickenpox   . Positive TB test   . Hernia   . Asthma     takes albuterol  . Asthma 07/07/2011  . Bronchitis 2008   Past Surgical History  Procedure Laterality Date  . Wisdom tooth extraction  2008  . Ventral hernia repair  09/20/2011    Procedure: HERNIA REPAIR VENTRAL ADULT;  Surgeon: Rulon Abide, DO;  Location: [facility] OR;  Service: General;  Laterality: N/A;   with possible mesh   Family History  Problem Relation Age of Onset  . Arthritis Other     Grandparent  . Hypertension Other     Parent, Grandparent  . Anesthesia problems Father   . Hypotension Neg Hx   . Malignant hyperthermia Neg Hx   . Pseudochol deficiency Neg Hx    Social History  Substance Use Topics  . Smoking status: Never Smoker   . Smokeless tobacco: Never Used  . Alcohol Use: Yes     Comment: Social drink    Review of Systems  HENT: Positive for dental problem.   All other  systems reviewed and are negative.     Allergies  Review of patient's allergies indicates no known allergies.  Home Medications   Prior to Admission medications   Medication Sig Start Date End Date Taking? Authorizing Provider  albuterol (PROVENTIL HFA;VENTOLIN HFA) 108 (90 BASE) MCG/ACT inhaler Inhale 2 puffs into the lungs every 4 (four) hours as needed for wheezing or shortness of breath. 12/09/13   Azalia Bilis, MD  HYDROcodone-acetaminophen (NORCO) 5-325 MG per tablet Take 1-2 tablets by mouth every 6 (six) hours as needed for severe pain. Patient not taking: Reported on 12/02/2014 04/09/14   Mercedes Camprubi-Soms, PA-C  naproxen (NAPROSYN) 500 MG tablet Take 1 tablet (500 mg total) by mouth 2 (two) times daily as needed for mild pain, moderate pain or headache (TAKE WITH MEALS.). Patient not taking: Reported on 12/02/2014 04/09/14   Mercedes Camprubi-Soms, PA-C   BP 138/86 mmHg  Pulse 52  Temp(Src) 98.1 F (36.7 C) (Oral)  Resp 16  Ht 6\' 4"  (1.93 m)  Wt 220 lb (99.791 kg)  BMI 26.79 kg/m2  SpO2 99% Physical Exam  Constitutional: He is oriented to person, place, and time. He appears well-developed and well-nourished. No distress.  HENT:  Head: Normocephalic and atraumatic.  Fair dentition. Right lower molar cracked and tender to percussion. Right lower gingival erythema and tenderness to palpation. No abscess.   Eyes: Conjunctivae and EOM are normal.  Neck: Normal range of motion.  Cardiovascular: Normal rate and regular rhythm.  Exam reveals no gallop and no friction rub.   No murmur heard. Pulmonary/Chest: Effort normal and breath sounds normal. He has no wheezes. He has no rales. He exhibits no tenderness.  Abdominal: Soft. He exhibits no distension. There is no tenderness. There is no rebound.  Musculoskeletal: Normal range of motion.  Lymphadenopathy:    He has no cervical adenopathy.  Neurological: He is alert and oriented to person, place, and time. Coordination  normal.  Speech is goal-oriented. Moves limbs without ataxia.   Skin: Skin is warm and dry.  Psychiatric: He has a normal mood and affect. His behavior is normal.  Nursing note and vitals reviewed.   ED Course  Procedures (including critical care time) Labs Review Labs Reviewed - No data to display  Imaging Review No results found. Gala Lewandowsky, personally reviewed and evaluated these images and lab results as part of my medical decision-making.   EKG Interpretation None      MDM   Final diagnoses:  Pain, dental    2:47 AM Patient will have veetid and mobic for pain. No signs of ludwigs angina. Patient will be discharged with dental referral and instructions to return with worsening or concerning symptoms.     Emilia Beck, PA-C 03/08/15 0250  Derwood Kaplan, MD 03/10/15 1009

## 2015-03-08 NOTE — ED Notes (Signed)
Family at bedside. 

## 2015-03-08 NOTE — ED Notes (Signed)
Pt arrived to the ED with a compliant of dental pain.  Pt has right lower dental pain that has been going on for two days.

## 2015-05-25 ENCOUNTER — Encounter (HOSPITAL_COMMUNITY): Payer: Self-pay | Admitting: Emergency Medicine

## 2015-05-25 ENCOUNTER — Emergency Department (HOSPITAL_COMMUNITY): Payer: PRIVATE HEALTH INSURANCE

## 2015-05-25 ENCOUNTER — Emergency Department (HOSPITAL_COMMUNITY)
Admission: EM | Admit: 2015-05-25 | Discharge: 2015-05-25 | Disposition: A | Discharge status: Home / Self Care | Payer: PRIVATE HEALTH INSURANCE | Attending: Emergency Medicine | Admitting: Emergency Medicine

## 2015-05-25 DIAGNOSIS — Z791 Long term (current) use of non-steroidal anti-inflammatories (NSAID): Secondary | ICD-10-CM | POA: Insufficient documentation

## 2015-05-25 DIAGNOSIS — Z8619 Personal history of other infectious and parasitic diseases: Secondary | ICD-10-CM | POA: Insufficient documentation

## 2015-05-25 DIAGNOSIS — Z8659 Personal history of other mental and behavioral disorders: Secondary | ICD-10-CM | POA: Insufficient documentation

## 2015-05-25 DIAGNOSIS — J40 Bronchitis, not specified as acute or chronic: Secondary | ICD-10-CM

## 2015-05-25 DIAGNOSIS — Z8719 Personal history of other diseases of the digestive system: Secondary | ICD-10-CM | POA: Insufficient documentation

## 2015-05-25 DIAGNOSIS — J45909 Unspecified asthma, uncomplicated: Secondary | ICD-10-CM | POA: Insufficient documentation

## 2015-05-25 DIAGNOSIS — R1032 Left lower quadrant pain: Secondary | ICD-10-CM

## 2015-05-25 LAB — CBC WITH DIFFERENTIAL/PLATELET
Basophils Absolute: 0.1 10*3/uL (ref 0.0–0.1)
Basophils Relative: 1 %
Eosinophils Absolute: 0.5 10*3/uL (ref 0.0–0.7)
Eosinophils Relative: 8 %
HCT: 45.6 % (ref 39.0–52.0)
Hemoglobin: 15.1 g/dL (ref 13.0–17.0)
LYMPHS PCT: 32 %
Lymphs Abs: 2.2 10*3/uL (ref 0.7–4.0)
MCH: 27.9 pg (ref 26.0–34.0)
MCHC: 33.1 g/dL (ref 30.0–36.0)
MCV: 84.1 fL (ref 78.0–100.0)
MONO ABS: 0.5 10*3/uL (ref 0.1–1.0)
Monocytes Relative: 7 %
Neutro Abs: 3.6 10*3/uL (ref 1.7–7.7)
Neutrophils Relative %: 52 %
PLATELETS: 292 10*3/uL (ref 150–400)
RBC: 5.42 MIL/uL (ref 4.22–5.81)
RDW: 12.4 % (ref 11.5–15.5)
WBC: 6.9 10*3/uL (ref 4.0–10.5)

## 2015-05-25 LAB — BASIC METABOLIC PANEL
Anion gap: 7 (ref 5–15)
BUN: 16 mg/dL (ref 6–20)
CO2: 27 mmol/L (ref 22–32)
Calcium: 9.5 mg/dL (ref 8.9–10.3)
Chloride: 105 mmol/L (ref 101–111)
Creatinine, Ser: 0.89 mg/dL (ref 0.61–1.24)
GFR calc Af Amer: >60 mL/min (ref >60)
Glucose, Bld: 89 mg/dL (ref 65–99)
Potassium: 4.3 mmol/L (ref 3.5–5.1)
Sodium: 139 mmol/L (ref 135–145)

## 2015-05-25 MED ORDER — ONDANSETRON HCL 4 MG/2ML IJ SOLN
4.0000 mg | Freq: Once | INTRAMUSCULAR | Status: AC
  Administered 2015-05-25: 4 mg via INTRAVENOUS
  Filled 2015-05-25: qty 2

## 2015-05-25 MED ORDER — IOHEXOL 300 MG/ML  SOLN
100.0000 mL | Freq: Once | INTRAMUSCULAR | Status: AC | PRN
  Administered 2015-05-25: 100 mL via INTRAVENOUS

## 2015-05-25 MED ORDER — IOHEXOL 300 MG/ML  SOLN
25.0000 mL | Freq: Once | INTRAMUSCULAR | Status: AC | PRN
  Administered 2015-05-25: 25 mL via ORAL

## 2015-05-25 MED ORDER — NAPROXEN 500 MG PO TABS: 500.0000 mg | ORAL_TABLET | Freq: Two times a day (BID) | ORAL | Status: DC

## 2015-05-25 MED ORDER — SODIUM CHLORIDE 0.9 % IV BOLUS (SEPSIS)
500.0000 mL | Freq: Once | INTRAVENOUS | Status: AC
  Administered 2015-05-25: 500 mL via INTRAVENOUS

## 2015-05-25 MED ORDER — SODIUM CHLORIDE 0.9 % IV SOLN
INTRAVENOUS | Status: DC
  Administered 2015-05-25: 20:00:00 via INTRAVENOUS

## 2015-05-25 NOTE — ED Notes (Signed)
Pt states that he has a history of a hernia and has been having more pain with that because he feels as though he got a cold over the weekend and has been coughing, causing strain on the area. Alert and oriented.

## 2015-05-25 NOTE — ED Notes (Signed)
Patient transported to CT 

## 2015-05-25 NOTE — Discharge Instructions (Signed)
Make an appointment to follow-up with your regular doctor as needed. Take the Naprosyn for the next 7 days to help with the groin pain. As we discussed no evidence of a hernia today and CT scan of the abdomen pelvis was normal.

## 2015-05-25 NOTE — ED Provider Notes (Addendum)
CSN: [id number]     Arrival date & time 05/25/15  1802 History   First MD Initiated Contact with Patient 05/25/15 1836     Chief Complaint  Patient presents with  . Groin Pain  . Cough     (Consider location/radiation/quality/duration/timing/severity/associated sxs/prior Treatment) Patient is a 24 y.o. male presenting with groin pain and cough. The history is provided by the patient.  Groin Pain Associated symptoms include abdominal pain. Pertinent negatives include no chest pain, no headaches and no shortness of breath.  Cough Associated symptoms: no chest pain, no fever, no headaches, no rash and no shortness of breath    patient with a history of upper rest for infection with congestion and cough occasionally productive occasionally with some streaking of some blood but no pools of blood. The following the coughing patient developed discomfort in the left groin left lower quadrant area. Patient concerned about a recurrent hernia. Patient had a ventral wall hernia repair in the past. No nausea no vomiting. Cough is improving.  Patient denies any swelling in the testicle area or any pain to the testicle area.  Past Medical History  Diagnosis Date  . Depression 07/07/2011  . Suicide attempt (H[facility]) 07/07/2011  . ADHD (attention deficit hyperactivity disorder) 07/07/2011  . Chlamydia infection 07/07/2011  . History of chickenpox   . Positive TB test   . Hernia   . Asthma     takes albuterol  . Asthma 07/07/2011  . Bronchitis 2008   Past Surgical History  Procedure Laterality Date  . Wisdom tooth extraction  2008  . Ventral hernia repair  09/20/2011    Procedure: HERNIA REPAIR VENTRAL ADULT;  Surgeon: Rulon AbideBrian David Layton, DO;  [facility];  Service: General;  Laterality: N/A;   with possible mesh   Family History  Problem Relation Age of Onset  . Arthritis Other     Grandparent  . Hypertension Other     Parent, Grandparent  . Anesthesia problems Father   . Hypotension Neg  Hx   . Malignant hyperthermia Neg Hx   . Pseudochol deficiency Neg Hx    Social History  Substance Use Topics  . Smoking status: Never Smoker   . Smokeless tobacco: Never Used  . Alcohol Use: Yes     Comment: Social drink    Review of Systems  Constitutional: Negative for fever.  HENT: Positive for congestion.   Eyes: Negative for redness.  Respiratory: Positive for cough. Negative for shortness of breath.   Cardiovascular: Negative for chest pain.  Gastrointestinal: Positive for abdominal pain. Negative for nausea and vomiting.  Genitourinary: Negative for dysuria, hematuria, discharge, scrotal swelling and testicular pain.  Musculoskeletal: Negative for back pain.  Skin: Negative for rash.  Neurological: Negative for headaches.  Hematological: Does not bruise/bleed easily.  Psychiatric/Behavioral: Negative for confusion.      Allergies  Review of patient's allergies indicates no known allergies.  Home Medications   Prior to Admission medications   Medication Sig Start Date End Date Taking? Authorizing Provider  albuterol (PROVENTIL HFA;VENTOLIN HFA) 108 (90 BASE) MCG/ACT inhaler Inhale 2 puffs into the lungs every 4 (four) hours as needed for wheezing or shortness of breath. 12/09/13  Yes Azalia BilisKevin Campos, MD  ibuprofen (ADVIL,MOTRIN) 200 MG tablet Take 800 mg by mouth every 6 (six) hours as needed for headache or moderate pain.   Yes Historical Provider, MD  meloxicam (MOBIC) 15 MG tablet Take 1 tablet (15 mg total) by mouth daily. 03/08/15   Yvonna AlanisKaitlyn  Szekalski, PA-C  naproxen (NAPROSYN) 500 MG tablet Take 1 tablet (500 mg total) by mouth 2 (two) times daily as needed for mild pain, moderate pain or headache (TAKE WITH MEALS.). Patient not taking: Reported on 12/02/2014 04/09/14   Mercedes Camprubi-Soms, PA-C  naproxen (NAPROSYN) 500 MG tablet Take 1 tablet (500 mg total) by mouth 2 (two) times daily. 05/25/15   Vanetta Mulders, MD  penicillin v potassium (VEETID) 500 MG tablet  Take 1 tablet (500 mg total) by mouth 4 (four) times daily. Patient not taking: Reported on 05/25/2015 03/08/15   Kaitlyn Szekalski, PA-C   BP 132/73 mmHg  Pulse 60  Temp(Src) 98 F (36.7 C) (Oral)  Resp 18  SpO2 99% Physical Exam  Constitutional: He is oriented to person, place, and time. He appears well-developed and well-nourished. No distress.  HENT:  Head: Normocephalic and atraumatic.  Mouth/Throat: Oropharynx is clear and moist.  Eyes: Conjunctivae and EOM are normal. Pupils are equal, round, and reactive to light.  Neck: Normal range of motion. Neck supple.  Cardiovascular: Normal rate, regular rhythm and normal heart sounds.   No murmur heard. Pulmonary/Chest: Effort normal and breath sounds normal. No respiratory distress. He has no wheezes.  Abdominal: Soft. Bowel sounds are normal. He exhibits no distension and no mass. There is tenderness.  Mild tenderness to palpation left lower quadrant. No mass.  Genitourinary: Penis normal.  No testicular swelling no tenderness no evidence of a groin hernia. Tenderness to palpation left lower quadrant and left groin area. No adenopathy.  Musculoskeletal: Normal range of motion.  Neurological: He is alert and oriented to person, place, and time. No cranial nerve deficit. He exhibits normal muscle tone. Coordination normal.  Skin: Skin is warm. No rash noted.  Nursing note and vitals reviewed.   ED Course  Procedures (including critical care time) Labs Review Labs Reviewed  CBC WITH DIFFERENTIAL/PLATELET  BASIC METABOLIC PANEL    Imaging Review Dg Chest 2 View  05/25/2015  CLINICAL DATA:  Productive cough and shortness of breath for 3 days. Chest pain. Asthma. EXAM: CHEST  2 VIEW COMPARISON:  10/24/2013 FINDINGS: The heart size and mediastinal contours are within normal limits. Both lungs are clear. The visualized skeletal structures are unremarkable. IMPRESSION: Negative.  No active cardiopulmonary disease. Electronically Signed    By: Myles Rosenthal M.D.   On: 05/25/2015 19:21   Ct Abdomen Pelvis W Contrast  05/25/2015  CLINICAL DATA:  Abdominal pain.  History of hernia. EXAM: CT ABDOMEN AND PELVIS WITH CONTRAST TECHNIQUE: Multidetector CT imaging of the abdomen and pelvis was performed using the standard protocol following bolus administration of intravenous contrast. CONTRAST:  25mL OMNIPAQUE IOHEXOL 300 MG/ML SOLN, OMNIPAQUE IOHEXOL 300 MG/ML SOLN COMPARISON:  None. FINDINGS: Lower chest: No significant pulmonary nodules or acute consolidative airspace disease. Hepatobiliary: Normal liver with no liver mass. Normal gallbladder with no radiopaque cholelithiasis. No biliary ductal dilatation. Pancreas: Normal, with no mass or duct dilation. Spleen: Normal size. No mass. Adrenals/Urinary Tract: Normal adrenals. Normal kidneys with no hydronephrosis and no renal mass. Normal bladder. Stomach/Bowel: Grossly normal stomach. Normal caliber small bowel with no small bowel wall thickening. Normal appendix. Normal large bowel with no diverticulosis, large bowel wall thickening or pericolonic fat stranding. Vascular/Lymphatic: Normal caliber abdominal aorta. Patent portal, splenic and renal veins. No pathologically enlarged lymph nodes in the abdomen or pelvis. Reproductive: Normal prostate and seminal vesicles. Other: No pneumoperitoneum, ascites or focal fluid collection. Musculoskeletal: No aggressive appearing focal osseous lesions. The patient  is status post midline supraumbilical ventral abdominal hernia repair, with no evidence of a recurrent ventral hernia. No fluid collection is seen adjacent to the hernia repair. No inguinal hernia. IMPRESSION: Status post supraumbilical ventral abdominal hernia repair, with no evidence of a recurrent ventral hernia. No acute abnormality. No evidence of bowel obstruction or acute bowel inflammation. Normal appendix. Electronically Signed   By: Delbert Phenix M.D.   On: 05/25/2015 21:36   I have  personally reviewed and evaluated these images and lab results as part of my medical decision-making.   EKG Interpretation None      MDM   Final diagnoses:  Groin pain, left    The patient presented with concern for possible left groin hernia. Patient's had a ventral hernia repair in the past. Patient's had discomfort in that area for the past 1-2 days. Patient's had an upper respiratory infection that seems to be consistent with a bronchitis. Chest x-ray negative for pneumonia.  We'll treat with Naprosyn that patient follow-up with regular doctor.  Vanetta Mulders, MD 05/25/15 2209  Vanetta Mulders, MD 05/25/15 2209

## 2015-11-04 ENCOUNTER — Emergency Department (HOSPITAL_COMMUNITY): Payer: No Typology Code available for payment source

## 2015-11-04 ENCOUNTER — Emergency Department (HOSPITAL_COMMUNITY)
Admission: EM | Admit: 2015-11-04 | Discharge: 2015-11-04 | Disposition: A | Discharge status: Home / Self Care | Payer: No Typology Code available for payment source | Attending: Emergency Medicine | Admitting: Emergency Medicine

## 2015-11-04 ENCOUNTER — Encounter (HOSPITAL_COMMUNITY): Payer: Self-pay | Admitting: Emergency Medicine

## 2015-11-04 DIAGNOSIS — S29001A Unspecified injury of muscle and tendon of front wall of thorax, initial encounter: Secondary | ICD-10-CM | POA: Diagnosis not present

## 2015-11-04 DIAGNOSIS — S43401A Unspecified sprain of right shoulder joint, initial encounter: Secondary | ICD-10-CM | POA: Insufficient documentation

## 2015-11-04 DIAGNOSIS — S161XXA Strain of muscle, fascia and tendon at neck level, initial encounter: Secondary | ICD-10-CM | POA: Diagnosis not present

## 2015-11-04 DIAGNOSIS — Y998 Other external cause status: Secondary | ICD-10-CM | POA: Diagnosis not present

## 2015-11-04 DIAGNOSIS — S79911A Unspecified injury of right hip, initial encounter: Secondary | ICD-10-CM | POA: Insufficient documentation

## 2015-11-04 DIAGNOSIS — S3992XA Unspecified injury of lower back, initial encounter: Secondary | ICD-10-CM | POA: Diagnosis not present

## 2015-11-04 DIAGNOSIS — S0990XA Unspecified injury of head, initial encounter: Secondary | ICD-10-CM | POA: Insufficient documentation

## 2015-11-04 DIAGNOSIS — Y9389 Activity, other specified: Secondary | ICD-10-CM | POA: Insufficient documentation

## 2015-11-04 DIAGNOSIS — S59901A Unspecified injury of right elbow, initial encounter: Secondary | ICD-10-CM | POA: Insufficient documentation

## 2015-11-04 DIAGNOSIS — Y92410 Unspecified street and highway as the place of occurrence of the external cause: Secondary | ICD-10-CM | POA: Insufficient documentation

## 2015-11-04 DIAGNOSIS — S4991XA Unspecified injury of right shoulder and upper arm, initial encounter: Secondary | ICD-10-CM | POA: Diagnosis present

## 2015-11-04 MED ORDER — CYCLOBENZAPRINE HCL 10 MG PO TABS: 10.0000 mg | ORAL_TABLET | Freq: Two times a day (BID) | ORAL | Status: DC | PRN

## 2015-11-04 MED ORDER — HYDROCODONE-ACETAMINOPHEN 5-325 MG PO TABS: 1.0000 | ORAL_TABLET | Freq: Four times a day (QID) | ORAL | Status: DC | PRN

## 2015-11-04 MED ORDER — NAPROXEN 500 MG PO TABS: 500.0000 mg | ORAL_TABLET | Freq: Two times a day (BID) | ORAL | Status: DC

## 2015-11-04 MED ORDER — HYDROCODONE-ACETAMINOPHEN 5-325 MG PO TABS
1.0000 | ORAL_TABLET | Freq: Once | ORAL | Status: AC
  Administered 2015-11-04: 1 via ORAL
  Filled 2015-11-04: qty 1
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
## 2015-11-04 NOTE — ED Notes (Signed)
Bed: [id number] Expected date:  Expected time:  Means of arrival:  Comments: Hold for triage 8
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
## 2016-03-28 ENCOUNTER — Encounter (HOSPITAL_COMMUNITY): Payer: Self-pay | Admitting: *Deleted

## 2016-03-28 ENCOUNTER — Emergency Department (HOSPITAL_COMMUNITY)
Admission: EM | Admit: 2016-03-28 | Discharge: 2016-03-28 | Disposition: A | Discharge status: Home / Self Care | Payer: Self-pay | Attending: Emergency Medicine | Admitting: Emergency Medicine

## 2016-03-28 DIAGNOSIS — Z79899 Other long term (current) drug therapy: Secondary | ICD-10-CM | POA: Insufficient documentation

## 2016-03-28 DIAGNOSIS — J4520 Mild intermittent asthma, uncomplicated: Secondary | ICD-10-CM | POA: Insufficient documentation

## 2016-03-28 DIAGNOSIS — Z7951 Long term (current) use of inhaled steroids: Secondary | ICD-10-CM | POA: Insufficient documentation

## 2016-03-28 DIAGNOSIS — F909 Attention-deficit hyperactivity disorder, unspecified type: Secondary | ICD-10-CM | POA: Insufficient documentation

## 2016-03-28 DIAGNOSIS — K219 Gastro-esophageal reflux disease without esophagitis: Secondary | ICD-10-CM | POA: Insufficient documentation

## 2016-03-28 MED ORDER — GI COCKTAIL ~~LOC~~
30.0000 mL | Freq: Once | ORAL | Status: AC
  Administered 2016-03-28: 30 mL via ORAL
  Filled 2016-03-28: qty 30

## 2016-03-28 MED ORDER — OMEPRAZOLE 20 MG PO CPDR: 20.0000 mg | DELAYED_RELEASE_CAPSULE | Freq: Two times a day (BID) | ORAL | 3 refills | Status: DC

## 2016-03-28 MED ORDER — ALBUTEROL SULFATE HFA 108 (90 BASE) MCG/ACT IN AERS
2.0000 | INHALATION_SPRAY | Freq: Once | RESPIRATORY_TRACT | Status: AC
  Administered 2016-03-28: 2 via RESPIRATORY_TRACT
  Filled 2016-03-28: qty 6.7

## 2016-03-28 MED ORDER — PREDNISONE 20 MG PO TABS: 40.0000 mg | ORAL_TABLET | Freq: Every day | ORAL | 0 refills | Status: DC

## 2016-03-28 NOTE — ED Notes (Signed)
PT DISCHARGED. INSTRUCTIONS AND PRESCRIPTIONS GIVEN. AAOX4. PT IN NO APPARENT DISTRESS OR PAIN. THE OPPORTUNITY TO ASK QUESTIONS WAS PROVIDED. 

## 2016-03-28 NOTE — ED Triage Notes (Signed)
Pt complains of burning in throat, cough, shortness of breath for the past month. Pt states Rolaids provide some temporary relief. Pt denies chest pain.

## 2016-03-28 NOTE — ED Provider Notes (Signed)
[facility]-EMERGENCY DEPT Provider Note   CSN: [id number] Arrival date & time: 03/28/16  1921     History   Chief Complaint Chief Complaint  Patient presents with  . Cough  . Shortness of Breath    HPI Earl Compton is a 25 y.o. male who presents emergency Department with chief complaint of burning pain in his stomach and esophagus, cough and wheezing. Patient states that the problem has been ongoing since August. He went to the beach and started having some burning and belching. Pain rating from his epigastrium up to his mouth. He took 4 L which relieved it. It was intermittent, but however, the past month has become constant, daily. He also states that it is worse when lying flat and at night. He states that he has a constant cough and thinks it is related to this problem. He complains that it is making his asthma worse. He has been wheezing and is out of his inhalers because his insurance has lapsed. Patient states that he does drink alcohol but usually only on the weekends. He states that he drinks fairly heavily on weekends, especially since it's football season. He denies any previous histories of similar.  HPI  Past Medical History:  Diagnosis Date  . ADHD (attention deficit hyperactivity disorder) 07/07/2011  . Asthma    takes albuterol  . Asthma 07/07/2011  . Bronchitis 2008  . Chlamydia infection 07/07/2011  . Depression 07/07/2011  . Hernia   . History of chickenpox   . Positive TB test   . Suicide attempt (HCC) 07/07/2011    Patient Active Problem List   Diagnosis Date Noted  . Hernia, umbilical 07/11/2011  . Preventative health care 07/07/2011  . Depression 07/07/2011  . Suicide attempt (HCC) 07/07/2011  . Asthma 07/07/2011  . ADHD (attention deficit hyperactivity disorder) 07/07/2011  . Chlamydia infection 07/07/2011    Past Surgical History:  Procedure Laterality Date  . VENTRAL HERNIA REPAIR  09/20/2011   Procedure: HERNIA REPAIR VENTRAL ADULT;  Surgeon:  Rulon Abide, DO;  Location: [facility] OR;  Service: General;  Laterality: N/A;   with possible mesh  . WISDOM TOOTH EXTRACTION  2008       Home Medications    Prior to Admission medications   Medication Sig Start Date End Date Taking? Authorizing Provider  albuterol (PROVENTIL HFA;VENTOLIN HFA) 108 (90 BASE) MCG/ACT inhaler Inhale 2 puffs into the lungs every 4 (four) hours as needed for wheezing or shortness of breath. Patient not taking: Reported on 11/04/2015 12/09/13   Azalia Bilis, MD  cyclobenzaprine (FLEXERIL) 10 MG tablet Take 1 tablet (10 mg total) by mouth 2 (two) times daily as needed for muscle spasms. 11/04/15   Gwyneth Sprout, MD  HYDROcodone-acetaminophen (NORCO/VICODIN) 5-325 MG tablet Take 1-2 tablets by mouth every 6 (six) hours as needed for severe pain. 11/04/15   Gwyneth Sprout, MD  ibuprofen (ADVIL,MOTRIN) 200 MG tablet Take 800 mg by mouth every 6 (six) hours as needed for headache or moderate pain.    Historical Provider, MD  meloxicam (MOBIC) 15 MG tablet Take 1 tablet (15 mg total) by mouth daily. Patient not taking: Reported on 11/04/2015 03/08/15   Emilia Beck, PA-C  naproxen (NAPROSYN) 500 MG tablet Take 1 tablet (500 mg total) by mouth 2 (two) times daily. 11/04/15   Gwyneth Sprout, MD  penicillin v potassium (VEETID) 500 MG tablet Take 1 tablet (500 mg total) by mouth 4 (four) times daily. Patient not taking: Reported on 05/25/2015 03/08/15  Emilia BeckKaitlyn Szekalski, PA-C    Family History Family History  Problem Relation Age of Onset  . Anesthesia problems Father   . Arthritis Other     Grandparent  . Hypertension Other     Parent, Grandparent  . Hypotension Neg Hx   . Malignant hyperthermia Neg Hx   . Pseudochol deficiency Neg Hx     Social History Social History  Substance Use Topics  . Smoking status: Never Smoker  . Smokeless tobacco: Never Used  . Alcohol use Yes     Comment: Social drink     Allergies   Review of patient's allergies  indicates no known allergies.   Review of Systems Review of Systems .Ten systems reviewed and are negative for acute change, except as noted in the HPI.    Physical Exam Updated Vital Signs BP 166/72 (BP Location: Left Arm)   Pulse 83   Temp 98.6 F (37 C) (Oral)   Resp 18   SpO2 99%   Physical Exam  Physical Exam  Nursing note and vitals reviewed. Constitutional: He appears well-developed and well-nourished. No distress.  HENT:  Head: Normocephalic and atraumatic.  Eyes: Conjunctivae normal are normal. No scleral icterus.  Neck: Normal range of motion. Neck supple.  Cardiovascular: Normal rate, regular rhythm and normal heart sounds.   Pulmonary/Chest: Effort normal and breath sounds normal. No respiratory distress.  Abdominal: Soft. There is no tenderness.  Musculoskeletal: He exhibits no edema.  Neurological: He is alert.  Skin: Skin is warm and dry. He is not diaphoretic.  Psychiatric: His behavior is normal.    ED Treatments / Results  Labs (all labs ordered are listed, but only abnormal results are displayed) Labs Reviewed - No data to display  EKG  EKG Interpretation None       Radiology No results found.  Procedures Procedures (including critical care time)  Medications Ordered in ED Medications  albuterol (PROVENTIL HFA;VENTOLIN HFA) 108 (90 Base) MCG/ACT inhaler 2 puff (not administered)     Initial Impression / Assessment and Plan / ED Course  I have reviewed the triage vital signs and the nursing notes.  Pertinent labs & imaging results that were available during my care of the patient were reviewed by me and considered in my medical decision making (see chart for details).  Clinical Course    Patient diagnosed with reflex. I discussed dietary modifications of the patient. I feel that his cough, which is likely caused by the chronic esophageal urinary T shin is also flaring up his asthma. Patient will be discharged with PPI, lifestyle  modification, including decreased alcohol intake, albuterol inhaler and a short prednisone burst. She does follow up with PCP and GI for worsening symptoms. I discussed return precautions with the patient. He appears safe for discharge at this time  Final Clinical Impressions(s) / ED Diagnoses   Final diagnoses:  None    New Prescriptions New Prescriptions   No medications on file     Arthor Captainbigail Charita Lindenberger, PA-C 03/28/16 2253    Tomasita CrumbleAdeleke Oni, MD 03/29/16 2152

## 2016-09-04 ENCOUNTER — Ambulatory Visit (INDEPENDENT_AMBULATORY_CARE_PROVIDER_SITE_OTHER): Payer: PRIVATE HEALTH INSURANCE | Admitting: Physician Assistant

## 2016-09-04 VITALS — BP 132/80 | HR 86 | Temp 98.5°F | Resp 18 | Ht 76.0 in | Wt 238.0 lb

## 2016-09-04 DIAGNOSIS — K644 Residual hemorrhoidal skin tags: Secondary | ICD-10-CM | POA: Diagnosis not present

## 2016-09-04 DIAGNOSIS — Z13220 Encounter for screening for lipoid disorders: Secondary | ICD-10-CM

## 2016-09-04 DIAGNOSIS — Z113 Encounter for screening for infections with a predominantly sexual mode of transmission: Secondary | ICD-10-CM | POA: Diagnosis not present

## 2016-09-04 DIAGNOSIS — Z130 Encounter for screening for diseases of the blood and blood-forming organs and certain disorders involving the immune mechanism: Secondary | ICD-10-CM

## 2016-09-04 DIAGNOSIS — Z0000 Encounter for general adult medical examination without abnormal findings: Secondary | ICD-10-CM | POA: Diagnosis not present

## 2016-09-04 DIAGNOSIS — Z1329 Encounter for screening for other suspected endocrine disorder: Secondary | ICD-10-CM | POA: Diagnosis not present

## 2016-09-04 DIAGNOSIS — Z23 Encounter for immunization: Secondary | ICD-10-CM

## 2016-09-04 DIAGNOSIS — Z13228 Encounter for screening for other metabolic disorders: Secondary | ICD-10-CM

## 2016-09-04 MED ORDER — HYDROCORTISONE ACETATE 25 MG RE SUPP: 25.0000 mg | Freq: Two times a day (BID) | RECTAL | 0 refills | Status: DC

## 2016-09-04 NOTE — Progress Notes (Signed)
09/04/2016 3:31 PM   DOB: 10/04/1990 / MRN: [id number]  SUBJECTIVE:  Earl Compton is a 26 y.o. male presenting for annual exam.  Recently just received insurance through his job.  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 He has a history of asthma.    Complains of an external hemmoroid that is improving.  Tried some cream last night which helped.  Is worried he will be getting worse. Denies exquisite pain at this time.   Immunization History  Administered Date(s) Administered  . Tdap 07/23/2010, 07/11/2011     He has No Known Allergies.   He  has a past medical history of ADHD (attention deficit hyperactivity disorder) (07/07/2011); Asthma; Asthma (07/07/2011); Bronchitis (2008); Chlamydia infection (07/07/2011); Depression (07/07/2011); Hernia; History of chickenpox; Positive TB test; and Suicide attempt (07/07/2011).    He  reports that he has never smoked. He has never used smokeless tobacco. He reports that he drinks alcohol. He reports that he does not use drugs. He  has no sexual activity history on file. The patient  has a past surgical history that includes Wisdom tooth extraction (2008) and Ventral hernia repair (09/20/2011).  His family history includes Anesthesia problems in his father; Arthritis in his other; Hypertension in his other.  Review of Systems  Constitutional: Negative for chills and fever.  Respiratory: Negative for cough and shortness of breath.   Cardiovascular: Negative for chest pain and leg swelling.  Gastrointestinal: Negative for heartburn and nausea.  Genitourinary: Negative for dysuria.  Musculoskeletal: Negative for myalgias.  Skin: Negative for itching and rash.  Neurological: Negative for dizziness.  Endo/Heme/Allergies: Negative for polydipsia.    The problem list and medications were reviewed and updated by myself where necessary and exist elsewhere in the encounter.   OBJECTIVE:  BP 132/80   Pulse 86    Temp 98.5 F (36.9 C) (Oral)   Resp 18   Ht '6\' 4"'  (1.93 m)   Wt 238 lb (108 kg)   SpO2 99%   BMI 28.97 kg/m   Physical Exam  Constitutional: He is oriented to person, place, and time. He appears well-developed. He does not appear ill.  Eyes: Conjunctivae and EOM are normal. Pupils are equal, round, and reactive to light.  Cardiovascular: Normal rate, regular rhythm and normal heart sounds.  Exam reveals no gallop, no friction rub and no decreased pulses.   No murmur heard. Pulmonary/Chest: Effort normal and breath sounds normal. No respiratory distress. He has no decreased breath sounds. He has no wheezes. He has no rhonchi. He has no rales.  Abdominal: He exhibits no distension.  Genitourinary:     Musculoskeletal: Normal range of motion. He exhibits no edema.  Neurological: He is alert and oriented to person, place, and time. No cranial nerve deficit. Coordination normal.  Skin: Skin is warm and dry. He is not diaphoretic.  Psychiatric: He has a normal mood and affect.  Nursing note and vitals reviewed.   No results found for this or any previous visit (from the past 72 hour(s)).  No results found.  ASSESSMENT AND PLAN:  Danniel was seen today for annual exam.  Diagnoses and all orders for this visit:  Annual physical exam -     Care order/instruction:  External hemorrhoid: Advised that if he is having any worsening pain, fever, chills to RTC for recheck.  -     hydrocortisone (ANUSOL-HC) 25 MG suppository; Place 1 suppository (25 mg total)  rectally 2 (two) times daily.  Screening for endocrine, metabolic and immunity disorder -     CMP14+EGFR -     TSH -     Hemoglobin A1c  Routine screening for STI (sexually transmitted infection) -     HIV antibody -     RPR -     GC/Chlamydia Probe Amp  Screening for lipid disorders -     Lipid panel  Screening for deficiency anemia -     CBC  Needs flu shot -     Flu Vaccine QUAD 36+ mos IM    The patient is  advised to call or return to clinic if he does not see an improvement in symptoms, or to seek the care of the closest emergency department if he worsens with the above plan.   Philis Fendt, MHS, PA-C [facility] 09/04/2016 3:31 PM

## 2016-09-04 NOTE — Patient Instructions (Signed)
     IF you received an x-ray today, you will receive an invoice from [facility]. Please contact  Radiology at [phone] with questions or concerns regarding your invoice.   IF you received labwork today, you will receive an invoice from [facility]. Please contact [facility] at [phone] with questions or concerns regarding your invoice.   Our billing staff will not be able to assist you with questions regarding bills from these companies.  You will be contacted with the lab results as soon as they are available. The fastest way to get your results is to activate your My Chart account. Instructions are located on the last page of this paperwork. If you have not heard from us regarding the results in 2 weeks, please contact this office.     

## 2016-09-05 LAB — CMP14+EGFR
ALT: 23 IU/L (ref 0–44)
AST: 22 IU/L (ref 0–40)
Albumin/Globulin Ratio: 1.9 (ref 1.2–2.2)
Albumin: 4.8 g/dL (ref 3.5–5.5)
Alkaline Phosphatase: 59 IU/L (ref 39–117)
BUN/Creatinine Ratio: 12 (ref 9–20)
BUN: 10 mg/dL (ref 6–20)
Bilirubin Total: 0.2 mg/dL (ref 0.0–1.2)
CO2: 19 mmol/L (ref 18–29)
Calcium: 9.7 mg/dL (ref 8.7–10.2)
Chloride: 100 mmol/L (ref 96–106)
Creatinine, Ser: 0.85 mg/dL (ref 0.76–1.27)
GFR calc non Af Amer: 120 mL/min/{1.73_m2} (ref >59)
GFR, EST AFRICAN AMERICAN: 139 mL/min/{1.73_m2} (ref >59)
GLUCOSE: 90 mg/dL (ref 65–99)
Globulin, Total: 2.5 g/dL (ref 1.5–4.5)
POTASSIUM: 4.1 mmol/L (ref 3.5–5.2)
Sodium: 143 mmol/L (ref 134–144)
TOTAL PROTEIN: 7.3 g/dL (ref 6.0–8.5)

## 2016-09-05 LAB — CBC
Hematocrit: 44.7 % (ref 37.5–51.0)
Hemoglobin: 15.4 g/dL (ref 13.0–17.7)
MCH: 28.2 pg (ref 26.6–33.0)
MCHC: 34.5 g/dL (ref 31.5–35.7)
MCV: 82 fL (ref 79–97)
PLATELETS: 337 10*3/uL (ref 150–379)
RBC: 5.47 x10E6/uL (ref 4.14–5.80)
RDW: 13.2 % (ref 12.3–15.4)
WBC: 7.1 10*3/uL (ref 3.4–10.8)

## 2016-09-05 LAB — HEMOGLOBIN A1C
Est. average glucose Bld gHb Est-mCnc: 117 mg/dL
Hgb A1c MFr Bld: 5.7 % — ABNORMAL HIGH (ref 4.8–5.6)

## 2016-09-05 LAB — LIPID PANEL
CHOLESTEROL TOTAL: 196 mg/dL (ref 100–199)
Chol/HDL Ratio: 2.9 ratio units (ref 0.0–5.0)
HDL: 68 mg/dL (ref >39)
LDL Calculated: 100 mg/dL — ABNORMAL HIGH (ref 0–99)
TRIGLYCERIDES: 141 mg/dL (ref 0–149)
VLDL CHOLESTEROL CAL: 28 mg/dL (ref 5–40)

## 2016-09-05 LAB — RPR: RPR: NONREACTIVE

## 2016-09-05 LAB — TSH: TSH: 2.34 u[IU]/mL (ref 0.450–4.500)

## 2016-09-05 LAB — HIV ANTIBODY (ROUTINE TESTING W REFLEX): HIV Screen 4th Generation wRfx: NONREACTIVE

## 2016-09-07 ENCOUNTER — Encounter: Payer: Self-pay | Admitting: *Deleted

## 2016-09-07 LAB — GC/CHLAMYDIA PROBE AMP
Chlamydia trachomatis, NAA: NEGATIVE
Neisseria gonorrhoeae by PCR: NEGATIVE

## 2016-09-07 NOTE — Progress Notes (Signed)
Please send a letter:  Mr. Earl Compton,  You blood work looks great.  Please mind you sugar intake and watch your weight closely.  Your A1c is a measure of blood sugar and yours is mildly elevated at 5.7.  While you are not a diabetic, you are technically prediabetic and at risk of becoming a diabetic.  I advise that you lose 5-10 lbs and get as much exercise as possible. Please call and make an appointment for 6 months so we can keep a close eye on this. Don't  be discouraged! You are a healthy young man.  We will just need to keep an eye on this numbers.   Deliah BostonMichael Clark, MS, PA-C 1:51 PM, 09/07/2016

## 2016-09-11 ENCOUNTER — Telehealth: Payer: Self-pay

## 2016-09-11 NOTE — Telephone Encounter (Signed)
Pt is looking for lab results   Best number [phone]

## 2016-09-11 NOTE — Telephone Encounter (Signed)
Pt advised.

## 2016-09-18 ENCOUNTER — Ambulatory Visit: Payer: Self-pay | Admitting: Family

## 2016-10-28 DIAGNOSIS — J4521 Mild intermittent asthma with (acute) exacerbation: Secondary | ICD-10-CM | POA: Insufficient documentation

## 2016-10-28 DIAGNOSIS — F909 Attention-deficit hyperactivity disorder, unspecified type: Secondary | ICD-10-CM | POA: Insufficient documentation

## 2016-10-29 ENCOUNTER — Emergency Department (HOSPITAL_COMMUNITY): Payer: Self-pay

## 2016-10-29 ENCOUNTER — Emergency Department (HOSPITAL_COMMUNITY)
Admission: EM | Admit: 2016-10-29 | Discharge: 2016-10-29 | Disposition: A | Discharge status: Home / Self Care | Payer: Self-pay | Attending: Emergency Medicine | Admitting: Emergency Medicine

## 2016-10-29 ENCOUNTER — Encounter (HOSPITAL_COMMUNITY): Payer: Self-pay

## 2016-10-29 DIAGNOSIS — J4521 Mild intermittent asthma with (acute) exacerbation: Secondary | ICD-10-CM

## 2016-10-29 MED ORDER — ALBUTEROL SULFATE (2.5 MG/3ML) 0.083% IN NEBU
5.0000 mg | INHALATION_SOLUTION | Freq: Once | RESPIRATORY_TRACT | Status: AC
  Administered 2016-10-29: 5 mg via RESPIRATORY_TRACT
  Filled 2016-10-29: qty 6

## 2016-10-29 MED ORDER — IBUPROFEN 800 MG PO TABS
800.0000 mg | ORAL_TABLET | Freq: Once | ORAL | Status: AC
  Administered 2016-10-29: 800 mg via ORAL
  Filled 2016-10-29: qty 1

## 2016-10-29 MED ORDER — PREDNISONE 20 MG PO TABS
60.0000 mg | ORAL_TABLET | Freq: Once | ORAL | Status: AC
  Administered 2016-10-29: 60 mg via ORAL
  Filled 2016-10-29: qty 3

## 2016-10-29 MED ORDER — PREDNISONE 20 MG PO TABS: ORAL_TABLET | ORAL | 0 refills | Status: DC

## 2016-10-29 MED ORDER — ALBUTEROL SULFATE HFA 108 (90 BASE) MCG/ACT IN AERS: 1.0000 | INHALATION_SPRAY | Freq: Four times a day (QID) | RESPIRATORY_TRACT | 0 refills | Status: AC | PRN

## 2016-10-29 NOTE — ED Triage Notes (Signed)
States since Friday increasing shortness of breath has hx of asthma with non productive cough voiced with fever and chills voiced. States ran out of inhaler.
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
## 2016-10-29 NOTE — ED Notes (Signed)
Pt had a decrease in expiratory wheezing upon ascultation in bilateral lung fields.

## 2016-10-29 NOTE — ED Notes (Signed)
Pt states that he has been having  Chills, fever and weakness  X 3 days and generalized pain when he cough. Pt has expiratory wheezes noted bilaterally. Pt does not appear in and distress and breathing is unlabored.

## 2016-11-28 ENCOUNTER — Emergency Department (HOSPITAL_COMMUNITY)
Admission: EM | Admit: 2016-11-28 | Discharge: 2016-11-28 | Disposition: A | Discharge status: Home / Self Care | Payer: Self-pay | Attending: Emergency Medicine | Admitting: Emergency Medicine

## 2016-11-28 ENCOUNTER — Emergency Department (HOSPITAL_COMMUNITY): Payer: Self-pay

## 2016-11-28 ENCOUNTER — Encounter (HOSPITAL_COMMUNITY): Payer: Self-pay | Admitting: Obstetrics and Gynecology

## 2016-11-28 DIAGNOSIS — J45909 Unspecified asthma, uncomplicated: Secondary | ICD-10-CM | POA: Insufficient documentation

## 2016-11-28 DIAGNOSIS — Y999 Unspecified external cause status: Secondary | ICD-10-CM | POA: Insufficient documentation

## 2016-11-28 DIAGNOSIS — Y929 Unspecified place or not applicable: Secondary | ICD-10-CM | POA: Insufficient documentation

## 2016-11-28 DIAGNOSIS — F909 Attention-deficit hyperactivity disorder, unspecified type: Secondary | ICD-10-CM | POA: Insufficient documentation

## 2016-11-28 DIAGNOSIS — M25562 Pain in left knee: Secondary | ICD-10-CM | POA: Insufficient documentation

## 2016-11-28 DIAGNOSIS — X509XXA Other and unspecified overexertion or strenuous movements or postures, initial encounter: Secondary | ICD-10-CM | POA: Insufficient documentation

## 2016-11-28 DIAGNOSIS — Z79899 Other long term (current) drug therapy: Secondary | ICD-10-CM | POA: Insufficient documentation

## 2016-11-28 DIAGNOSIS — Y939 Activity, unspecified: Secondary | ICD-10-CM | POA: Insufficient documentation

## 2016-11-28 NOTE — ED Triage Notes (Signed)
Pt reports he has hyperextended his left knee approximately 4 times. He says he has been having trouble with it for about 2 weeks. Pt states he heard a "pop" the second time it was hyperextended.

## 2016-11-28 NOTE — Discharge Instructions (Signed)
Read the information below.  You may return to the Emergency Department at any time for worsening condition or any new symptoms that concern you.  If you develop uncontrolled pain, weakness or numbness of the extremity, severe discoloration of the skin, or you are unable to walk or move your knee, return to the ER for a recheck.    °

## 2016-11-28 NOTE — ED Provider Notes (Signed)
[facility]-EMERGENCY DEPT Provider Note   CSN: [id number] Arrival date & time: 11/28/16  1424  By signing my name below, I, Earl Compton, attest that this documentation has been prepared under the direction and in the presence of [facility], PA-C.  Electronically Signed: Rosario Compton, ED Scribe. 11/28/16. 3:23 PM.  History   Chief Complaint Chief Complaint  Patient presents with  . Knee Pain   The history is provided by the patient. No language interpreter was used.    HPI Comments: Earl Compton is a 26 y.o. male who presents to the Emergency Department complaining of left knee pain beginning three weeks. Per pt, over the past three weeks he had hyperextended his left knee approximately four times. He describes this as seeing his patella shift to different sides of his knee. The most recent incident of this he heard a sudden "popping" sensation to the knee as well. This has occurred mostly with sudden bending or twisting movements of the knee. His pain is exacerbated with activity and alleviated at rest. No other treatments for his symptoms were tried prior to coming into the ED. No h/o significant trauma to the joint. No recent illnesses/infections. He denies fever, chills, weakness, numbness, or any other associated symptoms.   Past Medical History:  Diagnosis Date  . ADHD (attention deficit hyperactivity disorder) 07/07/2011  . Asthma    takes albuterol  . Asthma 07/07/2011  . Bronchitis 2008  . Chlamydia infection 07/07/2011  . Depression 07/07/2011  . Hernia   . History of chickenpox   . Positive TB test   . Suicide attempt (HCC) 07/07/2011   There are no active problems to display for this patient.  Past Surgical History:  Procedure Laterality Date  . VENTRAL HERNIA REPAIR  09/20/2011   Procedure: HERNIA REPAIR VENTRAL ADULT;  Surgeon: Rulon Abide, DO;  Location: [facility] OR;  Service: General;  Laterality: N/A;   with possible mesh  . WISDOM TOOTH EXTRACTION   2008    Home Medications    Prior to Admission medications   Medication Sig Start Date End Date Taking? Authorizing Provider  albuterol (PROVENTIL HFA;VENTOLIN HFA) 108 (90 Base) MCG/ACT inhaler Inhale 1-2 puffs into the lungs every 6 (six) hours as needed for wheezing or shortness of breath. 10/29/16  Yes Palumbo, April, MD   Family History Family History  Problem Relation Age of Onset  . Anesthesia problems Father   . Arthritis Other     Grandparent  . Hypertension Other     Parent, Grandparent  . Hypotension Neg Hx   . Malignant hyperthermia Neg Hx   . Pseudochol deficiency Neg Hx    Social History Social History  Substance Use Topics  . Smoking status: Never Smoker  . Smokeless tobacco: Never Used  . Alcohol use Yes     Comment: Social drink   Allergies   Patient has no known allergies.  Review of Systems Review of Systems  Constitutional: Negative for chills and fever.  Cardiovascular: Negative for leg swelling.  Musculoskeletal: Positive for arthralgias and myalgias.  Skin: Negative for color change, pallor and wound.  Allergic/Immunologic: Negative for immunocompromised state.  Neurological: Negative for weakness and numbness.  Hematological: Does not bruise/bleed easily.  Psychiatric/Behavioral: Negative for self-injury.   Physical Exam Updated Vital Signs BP (!) 143/76   Pulse 70   Temp 98.1 F (36.7 C) (Oral)   Resp 14   Ht 6\' 5"  (1.956 m)   Wt 95.3 kg  SpO2 100%   BMI 24.90 kg/m   Physical Exam  Constitutional: He appears well-developed and well-nourished. No distress.  HENT:  Head: Normocephalic and atraumatic.  Neck: Neck supple.  Pulmonary/Chest: Effort normal.  Musculoskeletal:       Left knee: He exhibits normal range of motion, no swelling, no effusion, no ecchymosis, no deformity, no laceration, no erythema, normal alignment, no LCL laxity, normal patellar mobility, no bony tenderness, normal meniscus and no MCL laxity. No tenderness  found. No medial joint line, no lateral joint line, no MCL, no LCL and no patellar tendon tenderness noted.       Left upper leg: Normal.       Left lower leg: Normal.  Pt unwilling to perform Thessellay test 2/2 pain.   Neurological: He is alert.  Skin: He is not diaphoretic.  Nursing note and vitals reviewed.  ED Treatments / Results  COORDINATION OF CARE: 3:22 PM-Discussed next steps with pt. Pt verbalized understanding and is agreeable with the plan.   Labs (all labs ordered are listed, but only abnormal results are displayed) Labs Reviewed - No data to display  EKG  EKG Interpretation None      Radiology Dg Knee Complete 4 Views Left  Result Date: 11/28/2016 CLINICAL DATA:  Pain and swelling EXAM: LEFT KNEE - COMPLETE 4+ VIEW COMPARISON:  Report of prior study September 21, 2012 available; images from that study not available. FINDINGS: Frontal, lateral, and bilateral oblique views were obtained. There is no fracture or dislocation. No joint effusion. The joint spaces appear normal. No erosive change. IMPRESSION: No fracture or joint effusion.  No appreciable arthropathy. Electronically Signed   By: Bretta BangWilliam  Woodruff III M.D.   On: 11/28/2016 16:06    Procedures Procedures  Medications Ordered in ED Medications - No data to display  Initial Impression / Assessment and Plan / ED Course  I have reviewed the triage vital signs and the nursing notes.  Pertinent labs & imaging results that were available during my care of the patient were reviewed by me and considered in my medical decision making (see chart for details).     Afebrile, nontoxic patient with intermittent pain in his left knee without injury.  Describes momentary instability of joint or shifting of patella, also reports pain with planting and twisting.  Suspect patellar subluxation or possibly meniscal injury.  No e/o septic joint.  Doubt acute injury.  Xray negative.  Placed in knee sleeve.   D/C home with  orthopedic follow up.  Discussed result, findings, treatment, and follow up  with patient.  Pt given return precautions.  Pt verbalizes understanding and agrees with plan.       Final Clinical Impressions(s) / ED Diagnoses   Final diagnoses:  Acute pain of left knee   New Prescriptions Discharge Medication List as of 11/28/2016  4:12 PM     I personally performed the services described in this documentation, which was scribed in my presence. The recorded information has been reviewed and is accurate.     Trixie DredgeWest, Breyah Akhter, PA-C 11/28/16 [id number]    Mancel BaleWentz, Elliott, MD 11/29/16 [phone]

## 2016-12-10 ENCOUNTER — Ambulatory Visit (INDEPENDENT_AMBULATORY_CARE_PROVIDER_SITE_OTHER): Payer: PRIVATE HEALTH INSURANCE | Admitting: Physician Assistant

## 2016-12-10 ENCOUNTER — Encounter: Payer: Self-pay | Admitting: Physician Assistant

## 2016-12-10 VITALS — BP 138/73 | HR 71 | Temp 97.7°F | Resp 16 | Ht 74.0 in | Wt 217.6 lb

## 2016-12-10 DIAGNOSIS — Z114 Encounter for screening for human immunodeficiency virus [HIV]: Secondary | ICD-10-CM

## 2016-12-10 DIAGNOSIS — Z113 Encounter for screening for infections with a predominantly sexual mode of transmission: Secondary | ICD-10-CM | POA: Diagnosis not present

## 2016-12-10 NOTE — Patient Instructions (Signed)
     IF you received an x-ray today, you will receive an invoice from [facility]. Please contact [facility] at [phone] with questions or concerns regarding your invoice.   IF you received labwork today, you will receive an invoice from [facility]. Please contact [facility] at [phone] with questions or concerns regarding your invoice.   Our billing staff will not be able to assist you with questions regarding bills from these companies.  You will be contacted with the lab results as soon as they are available. The fastest way to get your results is to activate your My Chart account. Instructions are located on the last page of this paperwork. If you have not heard from us regarding the results in 2 weeks, please contact this office.     

## 2016-12-10 NOTE — Progress Notes (Addendum)
12/10/2016 10:25 AM   DOB: 12/15/1990 / MRN: [id number]  SUBJECTIVE:  Earl Compton is a 26 y.o. male presenting for STD screening. He is typically monogamous however did step out of the relationship with a male about two weeks ago.  Was screened about 4 months ago. He denies any penile rash, discharge, testicular pain, dysuria, frequency.  Tells me this was a mistake on his part and wants to make things right with his wife of three years.  He has told her. He did wear a condom and tells me the condom did not fail.   He has No Known Allergies.   He  has a past medical history of ADHD (attention deficit hyperactivity disorder) (07/07/2011); Asthma; Asthma (07/07/2011); Bronchitis (2008); Chlamydia infection (07/07/2011); Depression (07/07/2011); Hernia; History of chickenpox; Positive TB test; and Suicide attempt (HCC) (07/07/2011).    He  reports that he has never smoked. He has never used smokeless tobacco. He reports that he drinks alcohol. He reports that he does not use drugs. He  has no sexual activity history on file. The patient  has a past surgical history that includes Wisdom tooth extraction (2008) and Ventral hernia repair (09/20/2011).  His family history includes Anesthesia problems in his father; Arthritis in his other; Hypertension in his other.  Review of Systems  Genitourinary: Negative for dysuria, flank pain, frequency, hematuria and urgency.  Neurological: Negative for dizziness.  Psychiatric/Behavioral: Negative for depression. The patient is nervous/anxious.     The problem list and medications were reviewed and updated by myself where necessary and exist elsewhere in the encounter.   OBJECTIVE:  BP 138/73 (BP Location: Right Arm, Patient Position: Sitting, Cuff Size: Large)   Pulse 71   Temp 97.7 F (36.5 C) (Oral)   Resp 16   Ht 6\' 2"  (1.88 m)   Wt 217 lb 9.6 oz (98.7 kg)   SpO2 98%   BMI 27.94 kg/m   Physical Exam  Constitutional: He is oriented to  person, place, and time. He appears well-developed and well-nourished.  Cardiovascular: Normal rate.   Pulmonary/Chest: Effort normal and breath sounds normal.  Musculoskeletal: Normal range of motion.  Neurological: He is alert and oriented to person, place, and time. He has normal reflexes.  Skin: Skin is warm and dry.  Psychiatric: His mood appears anxious. His affect is not angry. He does not exhibit a depressed mood.    Wt Readings from Last 3 Encounters:  12/10/16 217 lb 9.6 oz (98.7 kg)  11/28/16 210 lb (95.3 kg)  10/29/16 230 lb (104.3 kg)     No results found for this or any previous visit (from the past 72 hour(s)).  No results found.  ASSESSMENT AND PLAN:  Earl Compton was seen today for std screening.  Diagnoses and all orders for this visit:  Screening examination for STD (sexually transmitted disease): Will screen. Did advise that there can be lag time in testing with RPR and HIV, however he did wear a condom and this did not fail, thus he is overall lower risk.  It does seem that he is here mostly as a show of good faith as he does not want his relationship with his wife to be damaged.   -     GC/Chlamydia Probe Amp -     Trichomonas vaginalis, RNA -     RPR  Screening for HIV (human immunodeficiency virus) -     HIV antibody    The patient is advised to call or return  to clinic if he does not see an improvement in symptoms, or to seek the care of the closest emergency department if he worsens with the above plan.   Deliah BostonMichael Clark, MHS, PA-C [facility] 12/10/2016 10:25 AM

## 2016-12-11 LAB — HIV ANTIBODY (ROUTINE TESTING W REFLEX): HIV SCREEN 4TH GENERATION: NONREACTIVE

## 2016-12-11 LAB — GC/CHLAMYDIA PROBE AMP
CHLAMYDIA, DNA PROBE: NEGATIVE
Neisseria gonorrhoeae by PCR: NEGATIVE

## 2016-12-11 LAB — TRICHOMONAS VAGINALIS, PROBE AMP: TRICH VAG BY NAA: NEGATIVE

## 2016-12-11 LAB — RPR: RPR: NONREACTIVE

## 2017-04-24 ENCOUNTER — Encounter (HOSPITAL_COMMUNITY): Payer: Self-pay | Admitting: *Deleted

## 2017-04-24 ENCOUNTER — Emergency Department (HOSPITAL_COMMUNITY)
Admission: EM | Admit: 2017-04-24 | Discharge: 2017-04-24 | Disposition: A | Discharge status: Home / Self Care | Payer: Self-pay | Attending: Emergency Medicine | Admitting: Emergency Medicine

## 2017-04-24 DIAGNOSIS — R0981 Nasal congestion: Secondary | ICD-10-CM | POA: Insufficient documentation

## 2017-04-24 DIAGNOSIS — J4520 Mild intermittent asthma, uncomplicated: Secondary | ICD-10-CM | POA: Insufficient documentation

## 2017-04-24 MED ORDER — PSEUDOEPHEDRINE HCL 30 MG PO TABS: 30.0000 mg | ORAL_TABLET | Freq: Four times a day (QID) | ORAL | 0 refills | Status: DC | PRN

## 2017-04-24 MED ORDER — IPRATROPIUM-ALBUTEROL 0.5-2.5 (3) MG/3ML IN SOLN
3.0000 mL | Freq: Once | RESPIRATORY_TRACT | Status: AC
  Administered 2017-04-24: 3 mL via RESPIRATORY_TRACT
  Filled 2017-04-24: qty 3

## 2017-04-24 MED ORDER — ALBUTEROL SULFATE HFA 108 (90 BASE) MCG/ACT IN AERS
2.0000 | INHALATION_SPRAY | RESPIRATORY_TRACT | Status: DC | PRN
  Administered 2017-04-24: 2 via RESPIRATORY_TRACT
  Filled 2017-04-24: qty 6.7

## 2017-04-24 MED ORDER — BENZONATATE 200 MG PO CAPS: 200.0000 mg | ORAL_CAPSULE | Freq: Three times a day (TID) | ORAL | 0 refills | Status: DC | PRN

## 2017-04-24 NOTE — ED Provider Notes (Signed)
[facility]-EMERGENCY DEPT Provider Note   CSN: [id number] Arrival date & time: 04/24/17  1018     History   Chief Complaint Chief Complaint  Patient presents with  . Nasal Congestion    HPI Earl Compton is a 26 y.o. male who presents to the ED for cough, congestion and wheezing that started 2 days ago. Patient reports that he ran out of his inhaler for his asthma. Patient has been taking OTC medications that helped some at first but not anymore.   The history is provided by the patient. No language interpreter was used.  URI   This is a new problem. The current episode started 2 days ago. There has been no fever. Associated symptoms include congestion, headaches, plugged ear sensation, sinus pain, cough and wheezing. Pertinent negatives include no chest pain, no abdominal pain, no nausea, no vomiting, no dysuria, no sore throat and no rash. Ear pain: feeling of fullness. He has tried other medications for the symptoms.    Past Medical History:  Diagnosis Date  . ADHD (attention deficit hyperactivity disorder) 07/07/2011  . Asthma    takes albuterol  . Asthma 07/07/2011  . Bronchitis 2008  . Chlamydia infection 07/07/2011  . Depression 07/07/2011  . Hernia   . History of chickenpox   . Positive TB test   . Suicide attempt (HCC) 07/07/2011    There are no active problems to display for this patient.   Past Surgical History:  Procedure Laterality Date  . VENTRAL HERNIA REPAIR  09/20/2011   Procedure: HERNIA REPAIR VENTRAL ADULT;  Surgeon: Rulon Abide, DO;  Location: [facility] OR;  Service: General;  Laterality: N/A;   with possible mesh  . WISDOM TOOTH EXTRACTION  2008       Home Medications    Prior to Admission medications   Medication Sig Start Date End Date Taking? Authorizing Provider  albuterol (PROVENTIL HFA;VENTOLIN HFA) 108 (90 Base) MCG/ACT inhaler Inhale 1-2 puffs into the lungs every 6 (six) hours as needed for wheezing or shortness of breath. 10/29/16    Palumbo, April, MD  benzonatate (TESSALON) 200 MG capsule Take 1 capsule (200 mg total) by mouth 3 (three) times daily as needed for cough. 04/24/17   Janne Napoleon, NP  pseudoephedrine (SUDAFED) 30 MG tablet Take 1 tablet (30 mg total) by mouth every 6 (six) hours as needed for congestion. 04/24/17   Janne Napoleon, NP    Family History Family History  Problem Relation Age of Onset  . Anesthesia problems Father   . Arthritis Other        Grandparent  . Hypertension Other        Parent, Grandparent  . Hypotension Neg Hx   . Malignant hyperthermia Neg Hx   . Pseudochol deficiency Neg Hx     Social History Social History  Substance Use Topics  . Smoking status: Never Smoker  . Smokeless tobacco: Never Used  . Alcohol use Yes     Comment: Social drink     Allergies   Patient has no known allergies.   Review of Systems Review of Systems  Constitutional: Positive for chills. Negative for fever.  HENT: Positive for congestion, sinus pain and sinus pressure. Negative for ear discharge, hearing loss, sore throat and trouble swallowing. Ear pain: feeling of fullness.   Eyes: Negative for photophobia, discharge, redness and itching.  Respiratory: Positive for cough, shortness of breath and wheezing.   Cardiovascular: Negative for chest pain.  Gastrointestinal: Negative for  abdominal pain, nausea and vomiting.  Genitourinary: Negative for dysuria, frequency and urgency.  Musculoskeletal: Positive for myalgias.  Skin: Negative for rash.  Neurological: Positive for headaches. Negative for syncope and light-headedness.  Psychiatric/Behavioral: Negative for confusion.     Physical Exam Updated Vital Signs BP 122/77 (BP Location: Right Arm)   Pulse 92   Temp 98.2 F (36.8 C) (Oral)   Resp 16   Ht  (1.93 m)   Wt 104.3 kg (230 lb)   SpO2 98%   BMI 28.00 kg/m   Physical Exam  Constitutional: He appears well-developed and well-nourished. No distress.  HENT:  Head:  Normocephalic and atraumatic.  Right Ear: Tympanic membrane normal.  Left Ear: Tympanic membrane normal.  Nose: Nose normal.  Mouth/Throat: Uvula is midline, oropharynx is clear and moist and mucous membranes are normal.  Eyes: Pupils are equal, round, and reactive to light. Conjunctivae and EOM are normal.  Neck: Normal range of motion. Neck supple.  Cardiovascular: Normal rate and regular rhythm.   Pulmonary/Chest: Effort normal. He has decreased breath sounds. Wheezes: occasional.  Abdominal: Soft. There is no tenderness.  Musculoskeletal: Normal range of motion.  Neurological: He is alert.  Skin: Skin is warm and dry.  Psychiatric: He has a normal mood and affect. His behavior is normal.  Nursing note and vitals reviewed.    ED Treatments / Results  Labs (all labs ordered are listed, but only abnormal results are displayed) Labs Reviewed - No data to display  Radiology No results found.  Procedures Procedures (including critical care time)  Medications Ordered in ED Medications  ipratropium-albuterol (DUONEB) 0.5-2.5 (3) MG/3ML nebulizer solution 3 mL (3 mLs Nebulization Given 04/24/17 1232)   Re examined after neb treatment and there is good air movement and patient reports feeling much better.   Initial Impression / Assessment and Plan / ED Course  I have reviewed the triage vital signs and the nursing notes. Patient with hx of asthma and symptoms today consistent with bronchitis likely viral etiology. Discussed that antibiotics are not indicated for viral infections. Pt will be discharged with symptomatic treatment. Return precautions discussed. Albuterol inhaler prior to d/c.  Verbalizes understanding and is agreeable with plan. Pt is hemodynamically stable & in NAD prior to dc.  Final Clinical Impressions(s) / ED Diagnoses   Final diagnoses:  Mild intermittent asthmatic bronchitis without complication  Sinus congestion    New Prescriptions Discharge Medication  List as of 04/24/2017  1:03 PM    START taking these medications   Details  benzonatate (TESSALON) 200 MG capsule Take 1 capsule (200 mg total) by mouth 3 (three) times daily as needed for cough., Starting Wed 04/24/2017, Print    pseudoephedrine (SUDAFED) 30 MG tablet Take 1 tablet (30 mg total) by mouth every 6 (six) hours as needed for congestion., Starting Wed 04/24/2017, Print         [place], [place], NP 04/24/17 2351    Benjiman Core, MD 04/25/17 [phone]

## 2017-04-24 NOTE — Discharge Instructions (Signed)
Use the inhaler 2 puffs every 4 hours as needed. Call the [facility] for follow up. Return here for worsening symptoms.

## 2017-04-24 NOTE — ED Triage Notes (Signed)
Patient is alert and oriented x4.  He is being seen for nasal congestion with drainage and a cough.  Patient denies any fevers or night sweats.

## 2018-02-06 ENCOUNTER — Emergency Department (HOSPITAL_COMMUNITY)
Admission: EM | Admit: 2018-02-06 | Discharge: 2018-02-06 | Disposition: A | Discharge status: Home / Self Care | Payer: Self-pay | Attending: Emergency Medicine | Admitting: Emergency Medicine

## 2018-02-06 ENCOUNTER — Other Ambulatory Visit: Payer: Self-pay

## 2018-02-06 ENCOUNTER — Encounter (HOSPITAL_COMMUNITY): Payer: Self-pay | Admitting: Emergency Medicine

## 2018-02-06 DIAGNOSIS — J45909 Unspecified asthma, uncomplicated: Secondary | ICD-10-CM | POA: Insufficient documentation

## 2018-02-06 DIAGNOSIS — R51 Headache: Secondary | ICD-10-CM | POA: Insufficient documentation

## 2018-02-06 DIAGNOSIS — R519 Headache, unspecified: Secondary | ICD-10-CM

## 2018-02-06 MED ORDER — KETOROLAC TROMETHAMINE 30 MG/ML IJ SOLN
30.0000 mg | Freq: Once | INTRAMUSCULAR | Status: AC
  Administered 2018-02-06: 30 mg via INTRAVENOUS
  Filled 2018-02-06: qty 1

## 2018-02-06 MED ORDER — PROCHLORPERAZINE EDISYLATE 10 MG/2ML IJ SOLN
10.0000 mg | Freq: Once | INTRAMUSCULAR | Status: AC
  Administered 2018-02-06: 10 mg via INTRAVENOUS
  Filled 2018-02-06: qty 2

## 2018-02-06 MED ORDER — SODIUM CHLORIDE 0.9 % IV BOLUS
1000.0000 mL | Freq: Once | INTRAVENOUS | Status: AC
  Administered 2018-02-06: 1000 mL via INTRAVENOUS

## 2018-02-06 NOTE — ED Triage Notes (Signed)
Pt states back pain started earlier this week, would take pain medicine to help. Was at work yesterday and severe headache started. [facility] and no relief.

## 2018-02-06 NOTE — ED Provider Notes (Signed)
[facility]-EMERGENCY DEPT Provider Note   CSN: [id number] Arrival date & time: 02/06/18  0746     History   Chief Complaint Chief Complaint  Patient presents with  . Headache    HPI Earl Compton is a 27 y.o. male.  HPI Patient is a [age] year old male who reports increasing headache over the past 3 days.  No significant vision changes.  No weakness of his arms or legs.  He is tried over-the-counter medications without improvement in his symptoms.  His headache worsened yesterday.  He works outside as an Journalist, newspaperauto mechanic in the heat.  [facility] and underwent laboratory studies and a CT scan of his head which were read as no significant abnormality.  I personally reviewed the patient's labs which were normal.  He presents today reporting persistent headache.  Denies nausea vomiting.  No neck pain or stiffness.  No fevers or chills.  No chest pain shortness of breath.  Denies abdominal pain.  Reports he did have some low back pain earlier in the week.   Past Medical History:  Diagnosis Date  . ADHD (attention deficit hyperactivity disorder) 07/07/2011  . Asthma    takes albuterol  . Asthma 07/07/2011  . Bronchitis 2008  . Chlamydia infection 07/07/2011  . Depression 07/07/2011  . Hernia   . History of chickenpox   . Positive TB test   . Suicide attempt (HCC) 07/07/2011    There are no active problems to display for this patient.   Past Surgical History:  Procedure Laterality Date  . VENTRAL HERNIA REPAIR  09/20/2011   Procedure: HERNIA REPAIR VENTRAL ADULT;  Surgeon: Rulon AbideBrian David Layton, DO;  [facility];  Service: General;  Laterality: N/A;   with possible mesh  . WISDOM TOOTH EXTRACTION  2008        Home Medications    Prior to Admission medications   Medication Sig Start Date End Date Taking? Authorizing Provider  albuterol (PROVENTIL HFA;VENTOLIN HFA) 108 (90 Base) MCG/ACT inhaler Inhale  1-2 puffs into the lungs every 6 (six) hours as needed for wheezing or shortness of breath. 10/29/16   Palumbo, April, MD    Family History Family History  Problem Relation Age of Onset  . Anesthesia problems Father   . Arthritis Other        Grandparent  . Hypertension Other        Parent, Grandparent  . Hypotension Neg Hx   . Malignant hyperthermia Neg Hx   . Pseudochol deficiency Neg Hx     Social History Social History   Tobacco Use  . Smoking status: Never Smoker  . Smokeless tobacco: Never Used  Substance Use Topics  . Alcohol use: Yes    Comment: Social drink  . Drug use: No     Allergies   Patient has no known allergies.   Review of Systems Review of Systems  All other systems reviewed and are negative.    Physical Exam Updated Vital Signs BP (!) 158/97 (BP Location: Right Arm)   Pulse 85   Temp 99.8 F (37.7 C) (Oral)   Ht 6\' 5"  (1.956 m)   Wt 99.8 kg (220 lb)   SpO2 99%   BMI 26.09 kg/m   Physical Exam  Constitutional: He is oriented to person, place, and time. He appears well-developed and well-nourished.  HENT:  Head: Normocephalic and atraumatic.  Eyes: Pupils are equal, round, and reactive to light.  EOM are normal.  Neck: Normal range of motion. Neck supple. No neck rigidity.  Cardiovascular: Normal rate, regular rhythm, normal heart sounds and intact distal pulses.  Pulmonary/Chest: Effort normal and breath sounds normal. No respiratory distress.  Abdominal: Soft. He exhibits no distension. There is no tenderness.  Musculoskeletal: Normal range of motion.  Lymphadenopathy:    He has no cervical adenopathy.  Neurological: He is alert and oriented to person, place, and time.  5/5 strength in major muscle groups of  bilateral upper and lower extremities. Speech normal. No facial asymetry.   Skin: Skin is warm and dry.  Psychiatric: He has a normal mood and affect. Judgment normal.  Nursing note and vitals reviewed.    ED Treatments /  Results  Labs (all labs ordered are listed, but only abnormal results are displayed) Labs Reviewed - No data to display  EKG None  Radiology No results found.  Procedures Procedures (including critical care time)  Medications Ordered in ED Medications  sodium chloride 0.9 % bolus 1,000 mL (1,000 mLs Intravenous New Bag/Given 02/06/18 0847)  prochlorperazine (COMPAZINE) injection 10 mg (10 mg Intravenous Given 02/06/18 0833)  ketorolac (TORADOL) 30 MG/ML injection 30 mg (30 mg Intravenous Given 02/06/18 1610)     Initial Impression / Assessment and Plan / ED Course  I have reviewed the triage vital signs and the nursing notes.  Pertinent labs & imaging results that were available during my care of the patient were reviewed by me and considered in my medical decision making (see chart for details).     Nonspecific headache.  Improved with fluids and migraine cocktail here in the emergency department.  No signs to suggest meningitis.  Discharged home in good condition.  He will need a primary care physician.  He is being given information on the [facility] health wellness center.  Outpatient neurology follow-up.  Given his young age I also recommended he follow-up with an optometrist for a full vision exam as sometimes headaches like this can be a refractory issue  Final Clinical Impressions(s) / ED Diagnoses   Final diagnoses:  Acute nonintractable headache, unspecified headache type    ED Discharge Orders    None       Azalia Bilis, MD 02/06/18 [phone]

## 2018-02-06 NOTE — ED Notes (Signed)
Patient verbalized understanding of discharge instructions, no questions. Patient reports relief of headache. Patient ambulated out of ED with steady gait in no distress.

## 2018-02-06 NOTE — Discharge Instructions (Addendum)
Have your vision evaluated by an optometrist  Develop a relationship with a primary care physician

## 2018-02-08 ENCOUNTER — Encounter (HOSPITAL_COMMUNITY): Payer: Self-pay

## 2018-02-08 ENCOUNTER — Emergency Department (HOSPITAL_COMMUNITY)
Admission: EM | Admit: 2018-02-08 | Discharge: 2018-02-08 | Disposition: A | Discharge status: Home / Self Care | Payer: Self-pay | Attending: Emergency Medicine | Admitting: Emergency Medicine

## 2018-02-08 ENCOUNTER — Other Ambulatory Visit: Payer: Self-pay

## 2018-02-08 DIAGNOSIS — J45909 Unspecified asthma, uncomplicated: Secondary | ICD-10-CM | POA: Insufficient documentation

## 2018-02-08 DIAGNOSIS — Z79899 Other long term (current) drug therapy: Secondary | ICD-10-CM | POA: Insufficient documentation

## 2018-02-08 DIAGNOSIS — G43901 Migraine, unspecified, not intractable, with status migrainosus: Secondary | ICD-10-CM | POA: Insufficient documentation

## 2018-02-08 MED ORDER — PROCHLORPERAZINE EDISYLATE 10 MG/2ML IJ SOLN
10.0000 mg | Freq: Once | INTRAMUSCULAR | Status: AC
Start: 2018-02-08 — End: 2018-02-08
  Administered 2018-02-08: 10 mg via INTRAVENOUS
  Filled 2018-02-08: qty 2

## 2018-02-08 MED ORDER — VALPROATE SODIUM 500 MG/5ML IV SOLN
1000.0000 mg | Freq: Once | INTRAVENOUS | Status: AC
  Administered 2018-02-08: 1000 mg via INTRAVENOUS
  Filled 2018-02-08: qty 10

## 2018-02-08 MED ORDER — DIPHENHYDRAMINE HCL 50 MG/ML IJ SOLN
25.0000 mg | Freq: Once | INTRAMUSCULAR | Status: AC
  Administered 2018-02-08: 25 mg via INTRAVENOUS
  Filled 2018-02-08: qty 1

## 2018-02-08 MED ORDER — SODIUM CHLORIDE 0.9 % IV BOLUS
1000.0000 mL | Freq: Once | INTRAVENOUS | Status: AC
  Administered 2018-02-08: 1000 mL via INTRAVENOUS

## 2018-02-08 MED ORDER — MAGNESIUM SULFATE 2 GM/50ML IV SOLN
2.0000 g | Freq: Once | INTRAVENOUS | Status: AC
  Administered 2018-02-08: 2 g via INTRAVENOUS
  Filled 2018-02-08: qty 50

## 2018-02-08 NOTE — ED Triage Notes (Addendum)
Pt having migraines since Sunday. Was seen for the same 2 days ago. Pt having pain behind eyes. Pt has tried naproxen, ibuprofen, massage, without relief.

## 2018-02-08 NOTE — ED Provider Notes (Signed)
[facility]-EMERGENCY DEPT Provider Note   CSN: [id number] Arrival date & time: 02/08/18  1201     History   Chief Complaint Chief Complaint  Patient presents with  . Migraine    HPI Earl Compton is a 27 y.o. male.  27 year old male with past medical history including asthma and ADHD who presents with headache.  He reports that over a week ago he began having bilateral back pain that he thinks is from muscle strain.  He then gradually developed a migraine type headache 6 days ago.  His headache is frontal and sometimes in the back of his head, constant, and worse with activity.  He has been working this week out in the heat which he states probably did not help.  He has had an occasional episode of vomiting.  He denies any significant photophobia or phonophobia.  No body aches, fevers, rash, URI symptoms, tick bites, or outdoor exposure.  He has tried naproxen, ibuprofen, Excedrin migraine, and a massage yesterday without relief.  He denies any visual changes or extremity weakness.  He was evaluated [facility] 2 days ago for his headache, reevaluated here afterwards due to ongoing pain. Both times he received migraine cocktail which he states did not improve his symptoms.   The history is provided by the patient.  Migraine     Past Medical History:  Diagnosis Date  . ADHD (attention deficit hyperactivity disorder) 07/07/2011  . Asthma    takes albuterol  . Asthma 07/07/2011  . Bronchitis 2008  . Chlamydia infection 07/07/2011  . Depression 07/07/2011  . Hernia   . History of chickenpox   . Positive TB test   . Suicide attempt (HCC) 07/07/2011    There are no active problems to display for this patient.   Past Surgical History:  Procedure Laterality Date  . VENTRAL HERNIA REPAIR  09/20/2011   Procedure: HERNIA REPAIR VENTRAL ADULT;  Surgeon: Rulon AbideBrian David Layton, DO;  [facility];  Service: General;  Laterality: N/A;   with possible mesh  .  WISDOM TOOTH EXTRACTION  2008        Home Medications    Prior to Admission medications   Medication Sig Start Date End Date Taking? Authorizing Provider  acetaminophen (TYLENOL) 500 MG tablet Take 500 mg by mouth every 6 (six) hours as needed for moderate pain or headache.   Yes [provider]  aspirin-acetaminophen-caffeine (EXCEDRIN MIGRAINE) [phone] MG tablet Take 1 tablet by mouth every 6 (six) hours as needed for headache.   Yes [provider]  albuterol (PROVENTIL HFA;VENTOLIN HFA) 108 (90 Base) MCG/ACT inhaler Inhale 1-2 puffs into the lungs every 6 (six) hours as needed for wheezing or shortness of breath. 10/29/16   Palumbo, April, MD    Family History Family History  Problem Relation Age of Onset  . Anesthesia problems Father   . Arthritis Other        Grandparent  . Hypertension Other        Parent, Grandparent  . Hypotension Neg Hx   . Malignant hyperthermia Neg Hx   . Pseudochol deficiency Neg Hx     Social History Social History   Tobacco Use  . Smoking status: Never Smoker  . Smokeless tobacco: Never Used  Substance Use Topics  . Alcohol use: Yes    Comment: Social drink  . Drug use: No     Allergies   Patient has no known allergies.   Review of Systems Review  of Systems All other systems reviewed and are negative except that which was mentioned in HPI   Physical Exam Updated Vital Signs BP 134/76   Pulse (!) 46   Temp (!) 97.5 F (36.4 C) (Oral)   Resp 16   Ht 6\' 5"  (1.956 m)   Wt 99.8 kg (220 lb)   SpO2 98%   BMI 26.09 kg/m   Physical Exam  Constitutional: He is oriented to person, place, and time. He appears well-developed and well-nourished. No distress.  Awake, alert  HENT:  Head: Normocephalic and atraumatic.  Eyes: Pupils are equal, round, and reactive to light. Conjunctivae and EOM are normal.  Neck: Neck supple.  Cardiovascular: Normal rate, regular rhythm and normal heart sounds.  No murmur  heard. Pulmonary/Chest: Effort normal and breath sounds normal. No respiratory distress.  Abdominal: Soft. Bowel sounds are normal. He exhibits no distension. There is no tenderness.  Musculoskeletal: He exhibits no edema.  Neurological: He is alert and oriented to person, place, and time. He has normal reflexes. No cranial nerve deficit. He exhibits normal muscle tone.  Fluent speech 5/5 strength and normal sensation x all 4 extremities  Skin: Skin is warm and dry.  Psychiatric: He has a normal mood and affect. Judgment and thought content normal.  Nursing note and vitals reviewed.    ED Treatments / Results  Labs (all labs ordered are listed, but only abnormal results are displayed) Labs Reviewed - No data to display  EKG None  Radiology No results found.  Procedures Procedures (including critical care time)  Medications Ordered in ED Medications  diphenhydrAMINE (BENADRYL) injection 25 mg (25 mg Intravenous Given 02/08/18 1435)  prochlorperazine (COMPAZINE) injection 10 mg (10 mg Intravenous Given 02/08/18 1435)  sodium chloride 0.9 % bolus 1,000 mL (1,000 mLs Intravenous New Bag/Given 02/08/18 1435)  magnesium sulfate IVPB 2 g 50 mL (2 g Intravenous New Bag/Given 02/08/18 1433)  valproate (DEPACON) 1,000 mg in dextrose 5 % 50 mL IVPB (1,000 mg Intravenous New Bag/Given 02/08/18 1502)     Initial Impression / Assessment and Plan / ED Course  I have reviewed the triage vital signs and the nursing notes.  Pertinent labs & imaging results that were available during my care of the patient were reviewed by me and considered in my medical decision making (see chart for details).    PT alert, comfortable, having conversation when I walked into room. VS normal. Normal neuro exam. I reviewed previous ED visits in chart, previous w/u included negative head CT. Given he is male and not overweight, I highly doubt IIH/pseudotumor. He has no known clotting risk factors making venous sinus  thrombosis unlikely. No signs/sx of infection therefore doubt meningoencephalitis or tick-borne illness. Gave IVF, benadryl, compazine, Mg, and decadron.  On reassessment, patient was awake, alert, and comfortable and stated that the medications had completely eliminated his headache.  I reemphasized the importance of having his vision checked as well as following up with [facility] neurology if recurrent headaches become a pattern for him.  Discussed supportive measures at home.  Extensively reviewed return precautions and he voiced understanding.  Final Clinical Impressions(s) / ED Diagnoses   Final diagnoses:  Migraine with status migrainosus, not intractable, unspecified migraine type    ED Discharge Orders    None       Oumou Smead, Ambrose Finland, MD 02/08/18 1614

## 2018-11-24 IMAGING — CR DG CHEST 2V
2 series · 2 of 2 positions shown · non-contrast
Comparison: 11/04/2015 chest radiograph

CLINICAL DATA: 25 y/o M; shortness of breath. History of positive
TB test.

EXAM:
CHEST  2 VIEW

[w chest pa]
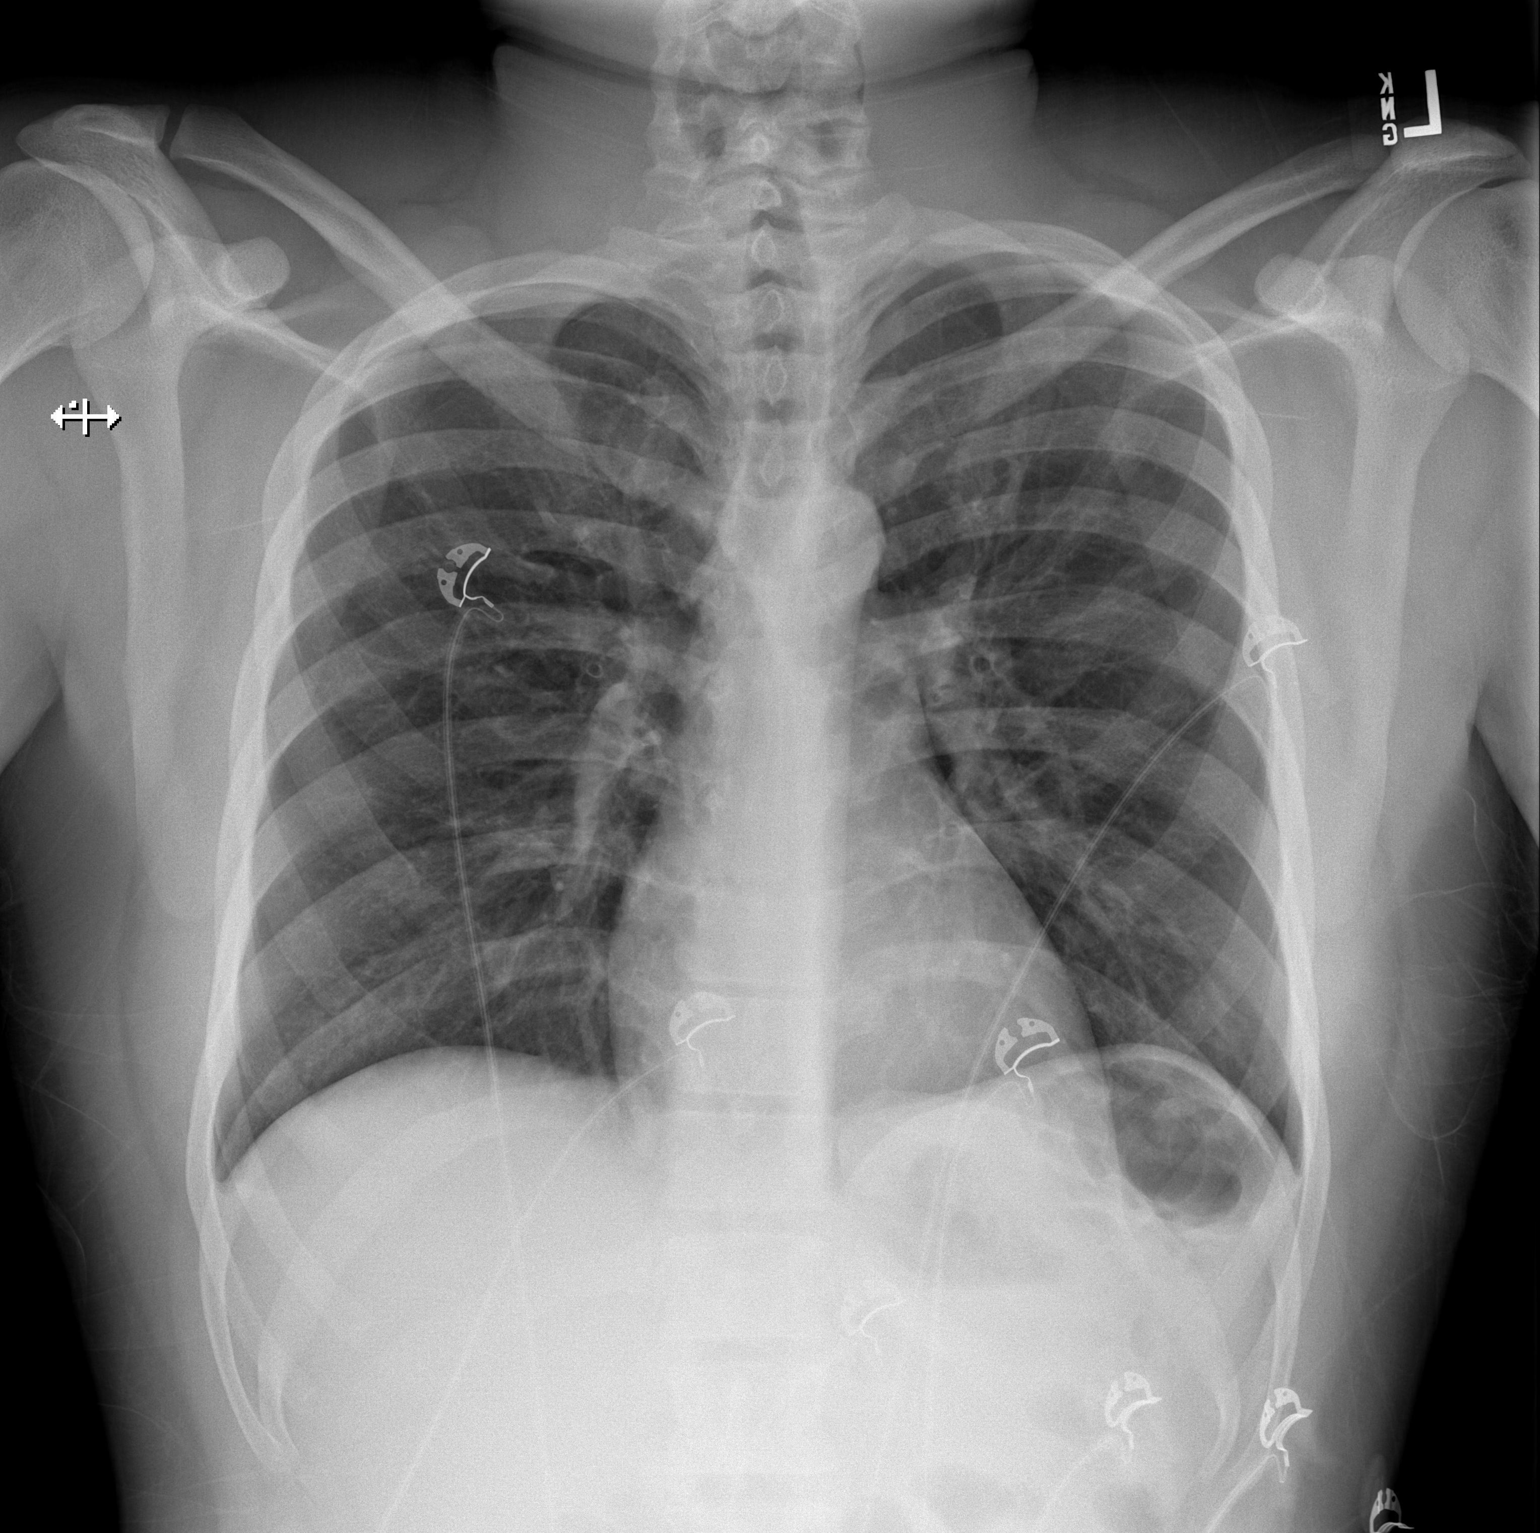

[w chest lat]
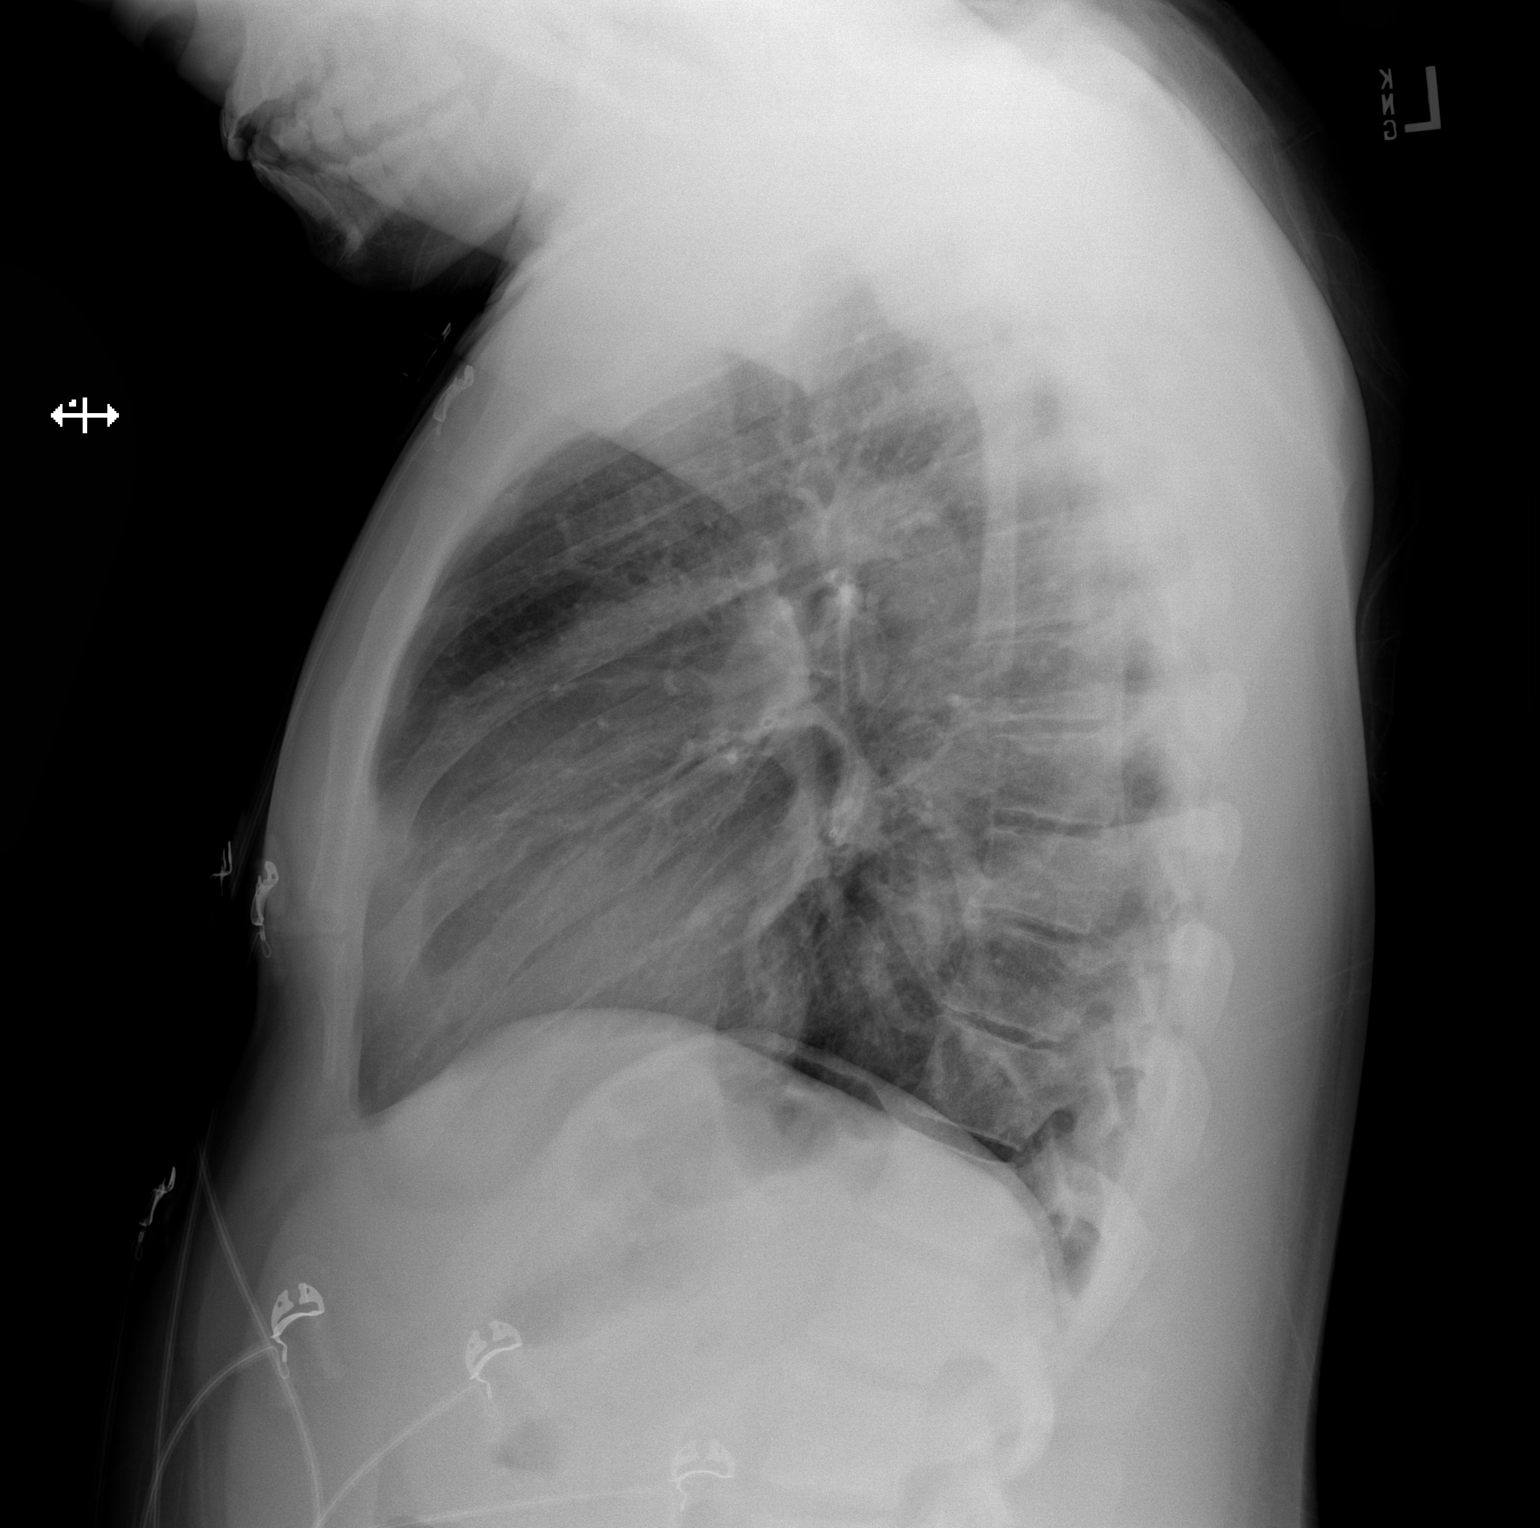

[2 of 2 positions shown; findings below may reference images not displayed]

FINDINGS: Stable heart size and mediastinal contours are within normal limits.
Both lungs are clear. The visualized skeletal structures are
unremarkable.
IMPRESSION: No active cardiopulmonary disease.

By: Alain Evrard Akezakimana M.D.

## 2020-10-12 ENCOUNTER — Emergency Department (HOSPITAL_BASED_OUTPATIENT_CLINIC_OR_DEPARTMENT_OTHER)
Admission: EM | Admit: 2020-10-12 | Discharge: 2020-10-12 | Disposition: A | Discharge status: Home / Self Care | Payer: Self-pay | Attending: Emergency Medicine | Admitting: Emergency Medicine

## 2020-10-12 ENCOUNTER — Other Ambulatory Visit: Payer: Self-pay

## 2020-10-12 ENCOUNTER — Encounter (HOSPITAL_BASED_OUTPATIENT_CLINIC_OR_DEPARTMENT_OTHER): Payer: Self-pay | Admitting: Obstetrics and Gynecology

## 2020-10-12 DIAGNOSIS — K029 Dental caries, unspecified: Secondary | ICD-10-CM | POA: Insufficient documentation

## 2020-10-12 DIAGNOSIS — Z7982 Long term (current) use of aspirin: Secondary | ICD-10-CM | POA: Insufficient documentation

## 2020-10-12 DIAGNOSIS — J45909 Unspecified asthma, uncomplicated: Secondary | ICD-10-CM | POA: Insufficient documentation

## 2020-10-12 DIAGNOSIS — K0889 Other specified disorders of teeth and supporting structures: Secondary | ICD-10-CM

## 2020-10-12 MED ORDER — PENICILLIN V POTASSIUM 500 MG PO TABS: 500.0000 mg | ORAL_TABLET | Freq: Three times a day (TID) | ORAL | 0 refills | Status: AC

## 2020-10-12 MED ORDER — TRAMADOL HCL 50 MG PO TABS: 50.0000 mg | ORAL_TABLET | Freq: Four times a day (QID) | ORAL | 0 refills | Status: AC | PRN

## 2020-10-12 NOTE — ED Triage Notes (Signed)
Patient reports x3 days ago he noticed tooth ache on the bottom left side of jaw. Patient endorses pain with chewing and cool liquids.

## 2020-10-12 NOTE — ED Provider Notes (Signed)
[facility] EMERGENCY DEPARTMENT Provider Note  CSN: [id number] Arrival date & time: 10/12/20 1746    History Chief Complaint  Patient presents with  . Dental Pain    HPI  Earl Compton is a 30 y.o. male presents for evaluation of toothache, left lower 2nd molar for three days. Pain is worse with chewing and cold liquids. No fever or swelling. No prior problems with that tooth. No difficulty swallowing. Does not have a dentist.    Past Medical History:  Diagnosis Date  . ADHD (attention deficit hyperactivity disorder) 07/07/2011  . Asthma    takes albuterol  . Asthma 07/07/2011  . Bronchitis 2008  . Chlamydia infection 07/07/2011  . Depression 07/07/2011  . Hernia   . History of chickenpox   . Positive TB test   . Suicide attempt (HCC) 07/07/2011    Past Surgical History:  Procedure Laterality Date  . VENTRAL HERNIA REPAIR  09/20/2011   Procedure: HERNIA REPAIR VENTRAL ADULT;  Surgeon: Rulon Abide, DO;  [facility] OR;  Service: General;  Laterality: N/A;   with possible mesh  . WISDOM TOOTH EXTRACTION  2008    Family History  Problem Relation Age of Onset  . Anesthesia problems Father   . Arthritis Other        Grandparent  . Hypertension Other        Parent, Grandparent  . Hypotension Neg Hx   . Malignant hyperthermia Neg Hx   . Pseudochol deficiency Neg Hx     Social History   Tobacco Use  . Smoking status: Never Smoker  . Smokeless tobacco: Never Used  Substance Use Topics  . Alcohol use: Yes    Alcohol/week: 7.0 standard drinks    Types: 7 Standard drinks or equivalent per week    Comment: Social drink  . Drug use: No     Home Medications Prior to Admission medications   Medication Sig Start Date End Date Taking? Authorizing Provider  penicillin v potassium (VEETID) 500 MG tablet Take 1 tablet (500 mg total) by mouth 3 (three) times daily for 7 days. 10/12/20 10/19/20 Yes Pollyann Savoy, MD  traMADol (ULTRAM) 50 MG  tablet Take 1 tablet (50 mg total) by mouth every 6 (six) hours as needed. 10/12/20  Yes Pollyann Savoy, MD  acetaminophen (TYLENOL) 500 MG tablet Take 500 mg by mouth every 6 (six) hours as needed for moderate pain or headache.    [provider]  albuterol (PROVENTIL HFA;VENTOLIN HFA) 108 (90 Base) MCG/ACT inhaler Inhale 1-2 puffs into the lungs every 6 (six) hours as needed for wheezing or shortness of breath. 10/29/16   Palumbo, April, MD  aspirin-acetaminophen-caffeine (EXCEDRIN MIGRAINE) [phone] MG tablet Take 1 tablet by mouth every 6 (six) hours as needed for headache.    [provider]     Allergies    Patient has no known allergies.   Review of Systems   Review of Systems A comprehensive review of systems was completed and negative except as noted in HPI.    Physical Exam BP 138/79 (BP Location: Left Arm)   Pulse 68   Temp 98.1 F (36.7 C)   Resp 18   Ht 6\' 5"  (1.956 m)   Wt 95.7 kg   SpO2 99%   BMI 25.02 kg/m   Physical Exam Vitals and nursing note reviewed.  Constitutional:      Appearance: Normal appearance.  HENT:     Head: Normocephalic and atraumatic.  Nose: Nose normal.     Mouth/Throat:     Mouth: Mucous membranes are moist.      Comments: Large dental caries at 30. No significant gingival swelling or flucutance Eyes:     Extraocular Movements: Extraocular movements intact.     Conjunctiva/sclera: Conjunctivae normal.  Cardiovascular:     Rate and Rhythm: Normal rate.  Pulmonary:     Effort: Pulmonary effort is normal.     Breath sounds: Normal breath sounds.  Abdominal:     General: Abdomen is flat.     Palpations: Abdomen is soft.     Tenderness: There is no abdominal tenderness.  Musculoskeletal:        General: No swelling. Normal range of motion.     Cervical back: Neck supple.  Skin:    General: Skin is warm and dry.     Findings: No rash (on exposed skin).  Neurological:     General: No focal deficit present.      Mental Status: He is alert and oriented to person, place, and time.  Psychiatric:        Mood and Affect: Mood normal.      ED Results / Procedures / Treatments   Labs (all labs ordered are listed, but only abnormal results are displayed) Labs Reviewed - No data to display  EKG None   Radiology No results found.  Procedures Procedures  Medications Ordered in the ED Medications - No data to display   MDM Rules/Calculators/A&P MDM Plan PCN, Tramadol for pain and Dental referral.   ED Course  I have reviewed the triage vital signs and the nursing notes.  Pertinent labs & imaging results that were available during my care of the patient were reviewed by me and considered in my medical decision making (see chart for details).     Final Clinical Impression(s) / ED Diagnoses Final diagnoses:  Toothache    Rx / DC Orders ED Discharge Orders         Ordered    traMADol (ULTRAM) 50 MG tablet  Every 6 hours PRN        10/12/20 1936    penicillin v potassium (VEETID) 500 MG tablet  3 times daily        10/12/20 1936           Pollyann Savoy, MD 10/12/20 [id number]

## 2021-01-25 ENCOUNTER — Other Ambulatory Visit: Payer: Self-pay

## 2021-01-25 ENCOUNTER — Emergency Department (HOSPITAL_BASED_OUTPATIENT_CLINIC_OR_DEPARTMENT_OTHER)
Admission: EM | Admit: 2021-01-25 | Discharge: 2021-01-25 | Disposition: A | Discharge status: Home / Self Care | Payer: Self-pay | Attending: Emergency Medicine | Admitting: Emergency Medicine

## 2021-01-25 ENCOUNTER — Encounter (HOSPITAL_BASED_OUTPATIENT_CLINIC_OR_DEPARTMENT_OTHER): Payer: Self-pay | Admitting: Emergency Medicine

## 2021-01-25 ENCOUNTER — Emergency Department (HOSPITAL_BASED_OUTPATIENT_CLINIC_OR_DEPARTMENT_OTHER): Payer: Self-pay | Admitting: Radiology

## 2021-01-25 DIAGNOSIS — T148XXA Other injury of unspecified body region, initial encounter: Secondary | ICD-10-CM

## 2021-01-25 DIAGNOSIS — Y990 Civilian activity done for income or pay: Secondary | ICD-10-CM | POA: Insufficient documentation

## 2021-01-25 DIAGNOSIS — R0781 Pleurodynia: Secondary | ICD-10-CM | POA: Insufficient documentation

## 2021-01-25 DIAGNOSIS — X500XXA Overexertion from strenuous movement or load, initial encounter: Secondary | ICD-10-CM | POA: Insufficient documentation

## 2021-01-25 DIAGNOSIS — J45909 Unspecified asthma, uncomplicated: Secondary | ICD-10-CM | POA: Insufficient documentation

## 2021-01-25 MED ORDER — METHOCARBAMOL 500 MG PO TABS
500.0000 mg | ORAL_TABLET | Freq: Two times a day (BID) | ORAL | 0 refills | Status: DC
  Filled 2021-01-25: qty 20, 10d supply, fill #0

## 2021-01-25 MED ORDER — METHOCARBAMOL 500 MG PO TABS: 500.0000 mg | ORAL_TABLET | Freq: Two times a day (BID) | ORAL | 0 refills | Status: AC

## 2021-01-25 NOTE — ED Provider Notes (Signed)
[facility] EMERGENCY DEPT Provider Note   CSN: [id number] Arrival date & time: 01/25/21  1743     History Chief Complaint  Patient presents with   Rib Injury    Earl Compton is a 30 y.o. male.  30 year old male who presents with right sided latissimus pain x6 days.  Denies any lifting at work.  Pain is characterizes sharp and worse with movements.  Denies any shortness of breath.  Denies any rashes.  Has been using Tylenol without relief      Past Medical History:  Diagnosis Date   ADHD (attention deficit hyperactivity disorder) 07/07/2011   Asthma    takes albuterol   Asthma 07/07/2011   Bronchitis 2008   Chlamydia infection 07/07/2011   Depression 07/07/2011   Hernia    History of chickenpox    Positive TB test    Suicide attempt (HCC) 07/07/2011    There are no problems to display for this patient.   Past Surgical History:  Procedure Laterality Date   VENTRAL HERNIA REPAIR  09/20/2011   Procedure: HERNIA REPAIR VENTRAL ADULT;  Surgeon: Rulon Abide, DO;  Location: [facility] OR;  Service: General;  Laterality: N/A;   with possible mesh   WISDOM TOOTH EXTRACTION  2008       Family History  Problem Relation Age of Onset   Anesthesia problems Father    Arthritis Other        Grandparent   Hypertension Other        Parent, Grandparent   Hypotension Neg Hx    Malignant hyperthermia Neg Hx    Pseudochol deficiency Neg Hx     Social History   Tobacco Use   Smoking status: Never   Smokeless tobacco: Never  Substance Use Topics   Alcohol use: Yes    Alcohol/week: 7.0 standard drinks    Types: 7 Standard drinks or equivalent per week    Comment: Social drink   Drug use: No    Home Medications Prior to Admission medications   Medication Sig Start Date End Date Taking? Authorizing Provider  acetaminophen (TYLENOL) 500 MG tablet Take 500 mg by mouth every 6 (six) hours as needed for moderate pain or headache.    [provider]   albuterol (PROVENTIL HFA;VENTOLIN HFA) 108 (90 Base) MCG/ACT inhaler Inhale 1-2 puffs into the lungs every 6 (six) hours as needed for wheezing or shortness of breath. 10/29/16   Palumbo, April, MD  aspirin-acetaminophen-caffeine (EXCEDRIN MIGRAINE) [phone] MG tablet Take 1 tablet by mouth every 6 (six) hours as needed for headache.    [provider]  traMADol (ULTRAM) 50 MG tablet Take 1 tablet (50 mg total) by mouth every 6 (six) hours as needed. 10/12/20   Pollyann Savoy, MD    Allergies    Patient has no known allergies.  Review of Systems   Review of Systems  All other systems reviewed and are negative.  Physical Exam Updated Vital Signs BP 130/80 (BP Location: Right Arm)   Pulse 67   Temp 98.2 F (36.8 C) (Oral)   Resp 16   Ht 1.93 m (6\' 4" )   Wt 100.7 kg   SpO2 99%   BMI 27.02 kg/m   Physical Exam Vitals and nursing note reviewed.  Constitutional:      General: He is not in acute distress.    Appearance: Normal appearance. He is well-developed. He is not toxic-appearing.  HENT:     Head: Normocephalic and atraumatic.  Eyes:  General: Lids are normal.     Conjunctiva/sclera: Conjunctivae normal.     Pupils: Pupils are equal, round, and reactive to light.  Neck:     Thyroid: No thyroid mass.     Trachea: No tracheal deviation.  Cardiovascular:     Rate and Rhythm: Normal rate and regular rhythm.     Heart sounds: Normal heart sounds. No murmur heard.   No gallop.  Pulmonary:     Effort: Pulmonary effort is normal. No respiratory distress.     Breath sounds: Normal breath sounds. No stridor. No decreased breath sounds, wheezing, rhonchi or rales.  Chest:     Comments: Pain along right latissimus dorsi. Abdominal:     General: There is no distension.     Palpations: Abdomen is soft.     Tenderness: There is no abdominal tenderness. There is no rebound.  Musculoskeletal:        General: No tenderness. Normal range of motion.     Cervical  back: Normal range of motion and neck supple.  Skin:    General: Skin is warm and dry.     Findings: No abrasion or rash.  Neurological:     Mental Status: He is alert and oriented to person, place, and time. Mental status is at baseline.     GCS: GCS eye subscore is 4. GCS verbal subscore is 5. GCS motor subscore is 6.     Cranial Nerves: Cranial nerves are intact. No cranial nerve deficit.     Sensory: No sensory deficit.     Motor: Motor function is intact.  Psychiatric:        Attention and Perception: Attention normal.        Speech: Speech normal.        Behavior: Behavior normal.    ED Results / Procedures / Treatments   Labs (all labs ordered are listed, but only abnormal results are displayed) Labs Reviewed - No data to display  EKG None  Radiology DG Chest 2 View  Result Date: 01/25/2021 CLINICAL DATA:  Right-sided rib pain EXAM: CHEST - 2 VIEW COMPARISON:  10/29/2016 FINDINGS: The heart size and mediastinal contours are within normal limits. Both lungs are clear. The visualized skeletal structures are unremarkable. IMPRESSION: No active cardiopulmonary disease. Electronically Signed   By: Deatra Robinson M.D.   On: 01/25/2021 19:09    Procedures Procedures   Medications Ordered in ED Medications - No data to display  ED Course  I have reviewed the triage vital signs and the nursing notes.  Pertinent labs & imaging results that were available during my care of the patient were reviewed by me and considered in my medical decision making (see chart for details).    MDM Rules/Calculators/A&P                          Chest x-ray is negative here.  Suspect muscle strain.  Will place on muscle relaxants and discharge Final Clinical Impression(s) / ED Diagnoses Final diagnoses:  None    Rx / DC Orders ED Discharge Orders     None        Lorre Nick, MD 01/25/21 2101

## 2021-01-25 NOTE — ED Notes (Signed)
This RN presented the AVS utilizing Teachback Method. Patient verbalizes understanding of Discharge Instructions. Opportunity for Questioning and Answers were provided. Patient Discharged from ED ambulatory to Home.   

## 2021-01-25 NOTE — ED Triage Notes (Addendum)
Pt arrives to ED with c/o of lower right sided rib pain that started x6 day ago. 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 Pt reports the pain is worse when he tried to sleep on the right side. Pt denies chest pain, SOB, N/V, fevers. Pt states he pain is "throbbing" with moments of "sharp" when he moves certain ways.

## 2021-01-26 ENCOUNTER — Other Ambulatory Visit (HOSPITAL_COMMUNITY): Payer: Self-pay

## 2021-02-20 ENCOUNTER — Emergency Department (HOSPITAL_BASED_OUTPATIENT_CLINIC_OR_DEPARTMENT_OTHER)
Admission: EM | Admit: 2021-02-20 | Discharge: 2021-02-20 | Disposition: A | Discharge status: Home / Self Care | Payer: Self-pay | Attending: Emergency Medicine | Admitting: Emergency Medicine

## 2021-02-20 ENCOUNTER — Emergency Department (HOSPITAL_BASED_OUTPATIENT_CLINIC_OR_DEPARTMENT_OTHER): Payer: Self-pay | Admitting: Radiology

## 2021-02-20 ENCOUNTER — Encounter (HOSPITAL_BASED_OUTPATIENT_CLINIC_OR_DEPARTMENT_OTHER): Payer: Self-pay | Admitting: Emergency Medicine

## 2021-02-20 ENCOUNTER — Other Ambulatory Visit: Payer: Self-pay

## 2021-02-20 DIAGNOSIS — J45909 Unspecified asthma, uncomplicated: Secondary | ICD-10-CM | POA: Insufficient documentation

## 2021-02-20 DIAGNOSIS — R0789 Other chest pain: Secondary | ICD-10-CM

## 2021-02-20 DIAGNOSIS — M940 Chondrocostal junction syndrome [Tietze]: Secondary | ICD-10-CM | POA: Insufficient documentation

## 2021-02-20 MED ORDER — NAPROXEN 500 MG PO TABS: 500.0000 mg | ORAL_TABLET | Freq: Two times a day (BID) | ORAL | 0 refills | Status: AC

## 2021-02-20 MED ORDER — CYCLOBENZAPRINE HCL 10 MG PO TABS: 10.0000 mg | ORAL_TABLET | Freq: Two times a day (BID) | ORAL | 0 refills | Status: AC | PRN

## 2021-02-20 NOTE — Discharge Instructions (Addendum)
Take the medications to help with the pain and discomfort.  Return to the emergency room for shortness of breath, fevers or chills

## 2021-02-20 NOTE — ED Triage Notes (Signed)
Pt complains of intermittent pain under his left rib that radiates down toward the left side of his lower back. Pt stated that it started last night and has gotten progressively worse.

## 2021-02-20 NOTE — ED Notes (Signed)
Patient verbalizes understanding of discharge instructions. Opportunity for questioning and answers were provided. Patient discharged from ED.  °

## 2021-02-20 NOTE — ED Provider Notes (Signed)
[facility] EMERGENCY DEPT Provider Note   CSN: [id number] Arrival date & time: 02/20/21  0735     History Chief Complaint  Patient presents with   Flank Pain    Earl Compton is a 30 y.o. male.   Flank Pain   Patient states he started having pain in his left chest/rib area.  Patient states he noticed it last night.  Patient states the pain increases when he takes a deep breath or when he moves certain way.  It also is slightly tender to the touch.  He denies any recent falls or injuries.  He is not having any fevers.  He is not coughing.  He does not feel short of breath.  Denies any leg swelling.  No abdominal pain.  No urinary issues.  Patient states he has been eating fine.  He does not feel like the exertion increases the symptoms.  He denies any leg swelling.  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   Past Surgical History:  Procedure Laterality Date   VENTRAL HERNIA REPAIR  09/20/2011   Procedure: HERNIA REPAIR VENTRAL ADULT;  Surgeon: Rulon Abide, DO;  Location: [facility] OR;  Service: General;  Laterality: N/A;   with possible mesh   WISDOM TOOTH EXTRACTION  2008       Family History  Problem Relation Age of Onset   Anesthesia problems Father    Arthritis Other        Grandparent   Hypertension Other        Parent, Grandparent   Hypotension Neg Hx    Malignant hyperthermia Neg Hx    Pseudochol deficiency Neg Hx     Social History   Tobacco Use   Smoking status: Never   Smokeless tobacco: Never  Substance Use Topics   Alcohol use: Yes    Alcohol/week: 7.0 standard drinks    Types: 7 Standard drinks or  equivalent per week    Comment: Social drink   Drug use: No    Home Medications Prior to Admission medications   Medication Sig Start Date End Date Taking? Authorizing Provider  cyclobenzaprine (FLEXERIL) 10 MG tablet Take 1 tablet (10 mg total) by mouth 2 (two) times daily as needed for muscle spasms. 02/20/21  Yes Linwood Dibbles, MD  naproxen (NAPROSYN) 500 MG tablet Take 1 tablet (500 mg total) by mouth 2 (two) times daily with a meal. As needed for pain 02/20/21  Yes Linwood Dibbles, MD  acetaminophen (TYLENOL) 500 MG tablet Take 500 mg by mouth every 6 (six) hours as needed for moderate pain or headache.    [provider]  albuterol (PROVENTIL HFA;VENTOLIN HFA) 108 (90 Base) MCG/ACT inhaler Inhale 1-2 puffs into the lungs every 6 (six) hours as needed for wheezing or shortness of breath. 10/29/16   Palumbo, April, MD  aspirin-acetaminophen-caffeine (EXCEDRIN MIGRAINE) [phone] MG tablet Take 1 tablet by mouth every 6 (six) hours as needed for headache.    [provider]  methocarbamol (ROBAXIN) 500 MG tablet Take 1 tablet (500 mg total) by mouth 2 (two) times daily. 01/25/21   Lorre Nick,  MD  traMADol (ULTRAM) 50 MG tablet Take 1 tablet (50 mg total) by mouth every 6 (six) hours as needed. 10/12/20   Pollyann Savoy, MD    Allergies    Patient has no known allergies.  Review of Systems   Review of Systems  Genitourinary:  Positive for flank pain.  All other systems reviewed and are negative.  Physical Exam Updated Vital Signs BP (!) 146/89 (BP Location: Right Arm)   Pulse 60   Temp 98.2 F (36.8 C) (Oral)   Resp 18   Ht 1.93 m (6\' 4" )   Wt 99.8 kg   SpO2 99%   BMI 26.78 kg/m   Physical Exam Vitals and nursing note reviewed.  Constitutional:      General: He is not in acute distress.    Appearance: He is well-developed.  HENT:     Head: Normocephalic and atraumatic.     Right Ear: External ear normal.     Left Ear: External ear normal.  Eyes:      General: No scleral icterus.       Right eye: No discharge.        Left eye: No discharge.     Conjunctiva/sclera: Conjunctivae normal.  Neck:     Trachea: No tracheal deviation.  Cardiovascular:     Rate and Rhythm: Normal rate and regular rhythm.  Pulmonary:     Effort: Pulmonary effort is normal. No respiratory distress.     Breath sounds: Normal breath sounds. No stridor. No wheezing or rales.  Chest:     Chest wall: Tenderness present. No mass, deformity or swelling.     Comments: TTP left costochondral region Abdominal:     General: Bowel sounds are normal. There is no distension.     Palpations: Abdomen is soft.     Tenderness: There is no abdominal tenderness. There is no guarding or rebound.  Musculoskeletal:        General: No tenderness or deformity.     Cervical back: Neck supple.  Skin:    General: Skin is warm and dry.     Findings: No rash.  Neurological:     General: No focal deficit present.     Mental Status: He is alert.     Cranial Nerves: No cranial nerve deficit (no facial droop, extraocular movements intact, no slurred speech).     Sensory: No sensory deficit.     Motor: No abnormal muscle tone or seizure activity.     Coordination: Coordination normal.  Psychiatric:        Mood and Affect: Mood normal.    ED Results / Procedures / Treatments   Labs (all labs ordered are listed, but only abnormal results are displayed) Labs Reviewed - No data to display  EKG EKG Interpretation  Date/Time:  Monday February 20 2021 07:41:43 EDT Ventricular Rate:  67 PR Interval:  158 QRS Duration: 97 QT Interval:  367 QTC Calculation: 388 R Axis:   50 Text Interpretation: Sinus rhythm Anteroseptal infarct, old Since last tracing rate faster Confirmed by 01-14-2002 [id number]) on 02/20/2021 7:44:08 AM  Radiology DG Chest 2 View  Result Date: 02/20/2021 CLINICAL DATA:  30 year old male with left-sided chest pain. EXAM: CHEST - 2 VIEW COMPARISON:  Multiple chest  radiographs, most recently 01/25/2021. FINDINGS: Cardiac silhouette is within normal limits. Normal lung volumes. No focal consolidation or mass. No pleural effusion or pneumothorax. No acute osseous abnormality. IMPRESSION: Normal chest. Electronically Signed   By: 03/28/2021  Mugweru MD   On: 02/20/2021 08:18    Procedures Procedures   Medications Ordered in ED Medications - No data to display  ED Course  I have reviewed the triage vital signs and the nursing notes.  Pertinent labs & imaging results that were available during my care of the patient were reviewed by me and considered in my medical decision making (see chart for details).  Clinical Course as of 02/20/21 0844  Mon Feb 20, 2021  0831 Chest x-ray negative [JK]    Clinical Course User Index [JK] Linwood Dibbles, MD   MDM Rules/Calculators/A&P                           Patient presents with complaints of left-sided rib chest pain.  Pain is sharp and increases with movement and palpation.  Patient does have reproducible chest wall tenderness.  EKG is unremarkable.  Symptoms not suggestive of acute cardiac etiology.  Patient also has not having any trouble with shortness of breath fevers or chills.  Low risk for PE PERC negative.  Chest x-ray does not show pneumonia pneumothorax or other acute abnormality.  Suspect musculoskeletal etiology.  Will treat with NSAIDs muscle relaxants.  Warning signs precautions discussed Final Clinical Impression(s) / ED Diagnoses Final diagnoses:  Costochondral chest pain    Rx / DC Orders ED Discharge Orders          Ordered    naproxen (NAPROSYN) 500 MG tablet  2 times daily with meals        02/20/21 0843    cyclobenzaprine (FLEXERIL) 10 MG tablet  2 times daily PRN        02/20/21 4166             Linwood Dibbles, MD 02/20/21 [phone]

## 2023-11-23 ENCOUNTER — Encounter (HOSPITAL_COMMUNITY): Payer: Self-pay

## 2023-11-23 ENCOUNTER — Emergency Department (HOSPITAL_COMMUNITY)
Admission: EM | Admit: 2023-11-23 | Discharge: 2023-11-23 | Disposition: A | Discharge status: Home / Self Care | Payer: Self-pay | Attending: Emergency Medicine | Admitting: Emergency Medicine

## 2023-11-23 DIAGNOSIS — S39012A Strain of muscle, fascia and tendon of lower back, initial encounter: Secondary | ICD-10-CM

## 2023-11-23 DIAGNOSIS — M545 Low back pain, unspecified: Secondary | ICD-10-CM | POA: Insufficient documentation

## 2023-11-23 MED ORDER — METHOCARBAMOL 500 MG PO TABS: 500.0000 mg | ORAL_TABLET | Freq: Two times a day (BID) | ORAL | 0 refills | Status: AC

## 2023-11-23 MED ORDER — LIDOCAINE 5 % EX PTCH: 1.0000 | MEDICATED_PATCH | CUTANEOUS | 0 refills | Status: AC

## 2023-11-23 NOTE — Discharge Instructions (Addendum)
 Use ibuprofen  as directed as well.

## 2023-11-23 NOTE — ED Provider Notes (Signed)
 [place] EMERGENCY DEPARTMENT AT [facility] Provider Note   CSN: [id number] Arrival date & time: 11/23/23  1052     History  Chief Complaint  Patient presents with   Back Pain    Throbbing back Pain     Earl Compton is a 33 y.o. male.  33 year old male who presents with bilateral lower lumbar pain.  Patient states that he does lifting of heavy objects.  Denies radicular symptoms.  No bowel or bladder dysfunction.  Pain is characterized as muscle spasm and worse with certain positions.  Patient has used OTCs without relief       Home Medications Prior to Admission medications   Medication Sig Start Date End Date Taking? Authorizing Provider  acetaminophen  (TYLENOL ) 500 MG tablet Take 500 mg by mouth every 6 (six) hours as needed for moderate pain or headache.    [provider]  albuterol  (PROVENTIL  HFA;VENTOLIN  HFA) 108 (90 Base) MCG/ACT inhaler Inhale 1-2 puffs into the lungs every 6 (six) hours as needed for wheezing or shortness of breath. 10/29/16   Palumbo, April, MD  aspirin-acetaminophen -caffeine (EXCEDRIN MIGRAINE) 250-250-65 MG tablet Take 1 tablet by mouth every 6 (six) hours as needed for headache.    [provider]  cyclobenzaprine  (FLEXERIL ) 10 MG tablet Take 1 tablet (10 mg total) by mouth 2 (two) times daily as needed for muscle spasms. 02/20/21   Trish Furl, MD  methocarbamol  (ROBAXIN ) 500 MG tablet Take 1 tablet (500 mg total) by mouth 2 (two) times daily. 01/25/21   Lind Repine, MD  naproxen  (NAPROSYN ) 500 MG tablet Take 1 tablet (500 mg total) by mouth 2 (two) times daily with a meal. As needed for pain 02/20/21   Trish Furl, MD  traMADol  (ULTRAM ) 50 MG tablet Take 1 tablet (50 mg total) by mouth every 6 (six) hours as needed. 10/12/20   Charmayne Cooper, MD      Allergies    Patient has no known allergies.    Review of Systems   Review of Systems  All other systems reviewed and are negative.   Physical Exam Updated Vital  Signs BP (!) 164/101 (BP Location: Left Arm)   Pulse 72   Temp 98.1 F (36.7 C) (Oral)   Resp 18   Ht 1.93 m (6\' 4" )   Wt 99.8 kg   SpO2 96%   BMI 26.78 kg/m  Physical Exam Vitals and nursing note reviewed.  Constitutional:      General: He is not in acute distress.    Appearance: Normal appearance. He is well-developed. He is not toxic-appearing.  HENT:     Head: Normocephalic and atraumatic.  Eyes:     General: Lids are normal.     Conjunctiva/sclera: Conjunctivae normal.     Pupils: Pupils are equal, round, and reactive to light.  Neck:     Thyroid: No thyroid mass.     Trachea: No tracheal deviation.  Cardiovascular:     Rate and Rhythm: Normal rate and regular rhythm.     Heart sounds: Normal heart sounds. No murmur heard.    No gallop.  Pulmonary:     Effort: Pulmonary effort is normal. No respiratory distress.     Breath sounds: Normal breath sounds. No stridor. No decreased breath sounds, wheezing, rhonchi or rales.  Abdominal:     General: There is no distension.     Palpations: Abdomen is soft.     Tenderness: There is no abdominal tenderness. There is no  rebound.  Musculoskeletal:        General: No tenderness. Normal range of motion.     Cervical back: Normal range of motion and neck supple.       Back:  Skin:    General: Skin is warm and dry.     Findings: No abrasion or rash.  Neurological:     General: No focal deficit present.     Mental Status: He is alert and oriented to person, place, and time. Mental status is at baseline.     GCS: GCS eye subscore is 4. GCS verbal subscore is 5. GCS motor subscore is 6.     Cranial Nerves: No cranial nerve deficit.     Sensory: No sensory deficit.     Motor: Motor function is intact.     Gait: Gait is intact.  Psychiatric:        Attention and Perception: Attention normal.        Speech: Speech normal.        Behavior: Behavior normal.     ED Results / Procedures / Treatments   Labs (all labs ordered  are listed, but only abnormal results are displayed) Labs Reviewed - No data to display  EKG None  Radiology No results found.  Procedures Procedures    Medications Ordered in ED Medications - No data to display  ED Course/ Medical Decision Making/ A&P                                 Medical Decision Making  Patient here with MSK back pain.  Will place on Lidoderm  patches as well as muscle relaxants and discharge        Final Clinical Impression(s) / ED Diagnoses Final diagnoses:  None    Rx / DC Orders ED Discharge Orders     None         Lind Repine, MD 11/23/23 1152

## 2023-11-23 NOTE — ED Triage Notes (Signed)
 Pt. Comes in from home with throbbing back pain getting worse over the past week. Unable to straighten the back, bent over in pain.  States he was working on his car 2 weeks ago when it all started.
